# Patient Record
Sex: Male | Born: 1955 | Race: White | Hispanic: No | Marital: Single | Smoking: Current every day smoker
Health system: Southern US, Community
[De-identification: ages and names within clinical notes are randomized; demographics above are authoritative.]

## PROBLEM LIST (undated history)

## (undated) DIAGNOSIS — L97929 Non-pressure chronic ulcer of unspecified part of left lower leg with unspecified severity: Secondary | ICD-10-CM

## (undated) DIAGNOSIS — F17200 Nicotine dependence, unspecified, uncomplicated: Secondary | ICD-10-CM

## (undated) DIAGNOSIS — J449 Chronic obstructive pulmonary disease, unspecified: Secondary | ICD-10-CM

## (undated) DIAGNOSIS — F1021 Alcohol dependence, in remission: Secondary | ICD-10-CM

---

## 2010-08-08 ENCOUNTER — Emergency Department (HOSPITAL_COMMUNITY): Admission: EM | Admit: 2010-08-08 | Discharge: 2010-08-08 | Payer: Self-pay | Admitting: Emergency Medicine

## 2019-09-17 ENCOUNTER — Inpatient Hospital Stay
Admission: EM | Admit: 2019-09-17 | Payer: Medicare Other | Source: Other Acute Inpatient Hospital | Admitting: Pulmonary Disease

## 2019-09-17 DIAGNOSIS — D72829 Elevated white blood cell count, unspecified: Secondary | ICD-10-CM

## 2019-09-17 DIAGNOSIS — I361 Nonrheumatic tricuspid (valve) insufficiency: Secondary | ICD-10-CM

## 2019-09-17 DIAGNOSIS — L03119 Cellulitis of unspecified part of limb: Secondary | ICD-10-CM

## 2019-09-17 DIAGNOSIS — J9691 Respiratory failure, unspecified with hypoxia: Secondary | ICD-10-CM

## 2019-09-17 DIAGNOSIS — D696 Thrombocytopenia, unspecified: Secondary | ICD-10-CM | POA: Diagnosis not present

## 2019-09-17 DIAGNOSIS — R5381 Other malaise: Secondary | ICD-10-CM

## 2019-09-17 DIAGNOSIS — R739 Hyperglycemia, unspecified: Secondary | ICD-10-CM

## 2019-09-17 DIAGNOSIS — J189 Pneumonia, unspecified organism: Secondary | ICD-10-CM

## 2019-09-17 DIAGNOSIS — E872 Acidosis: Secondary | ICD-10-CM | POA: Diagnosis not present

## 2019-09-17 DIAGNOSIS — R7401 Elevation of levels of liver transaminase levels: Secondary | ICD-10-CM

## 2019-09-18 DIAGNOSIS — E872 Acidosis: Secondary | ICD-10-CM | POA: Diagnosis not present

## 2019-09-18 DIAGNOSIS — D696 Thrombocytopenia, unspecified: Secondary | ICD-10-CM | POA: Diagnosis not present

## 2019-09-18 DIAGNOSIS — R739 Hyperglycemia, unspecified: Secondary | ICD-10-CM | POA: Diagnosis not present

## 2019-09-18 DIAGNOSIS — J9691 Respiratory failure, unspecified with hypoxia: Secondary | ICD-10-CM | POA: Diagnosis not present

## 2019-09-19 DIAGNOSIS — R739 Hyperglycemia, unspecified: Secondary | ICD-10-CM | POA: Diagnosis not present

## 2019-09-19 DIAGNOSIS — D696 Thrombocytopenia, unspecified: Secondary | ICD-10-CM | POA: Diagnosis not present

## 2019-09-19 DIAGNOSIS — E872 Acidosis: Secondary | ICD-10-CM | POA: Diagnosis not present

## 2019-09-19 DIAGNOSIS — J9691 Respiratory failure, unspecified with hypoxia: Secondary | ICD-10-CM | POA: Diagnosis not present

## 2019-09-20 DIAGNOSIS — J9691 Respiratory failure, unspecified with hypoxia: Secondary | ICD-10-CM | POA: Diagnosis not present

## 2019-09-20 DIAGNOSIS — D696 Thrombocytopenia, unspecified: Secondary | ICD-10-CM | POA: Diagnosis not present

## 2019-09-20 DIAGNOSIS — E872 Acidosis: Secondary | ICD-10-CM | POA: Diagnosis not present

## 2019-09-20 DIAGNOSIS — R739 Hyperglycemia, unspecified: Secondary | ICD-10-CM | POA: Diagnosis not present

## 2019-09-21 DIAGNOSIS — R739 Hyperglycemia, unspecified: Secondary | ICD-10-CM | POA: Diagnosis not present

## 2019-09-21 DIAGNOSIS — E872 Acidosis: Secondary | ICD-10-CM | POA: Diagnosis not present

## 2019-09-21 DIAGNOSIS — D696 Thrombocytopenia, unspecified: Secondary | ICD-10-CM | POA: Diagnosis not present

## 2019-09-21 DIAGNOSIS — J9691 Respiratory failure, unspecified with hypoxia: Secondary | ICD-10-CM | POA: Diagnosis not present

## 2019-09-22 DIAGNOSIS — J9691 Respiratory failure, unspecified with hypoxia: Secondary | ICD-10-CM | POA: Diagnosis not present

## 2019-09-22 DIAGNOSIS — D696 Thrombocytopenia, unspecified: Secondary | ICD-10-CM | POA: Diagnosis not present

## 2019-09-22 DIAGNOSIS — R739 Hyperglycemia, unspecified: Secondary | ICD-10-CM | POA: Diagnosis not present

## 2019-09-22 DIAGNOSIS — E872 Acidosis: Secondary | ICD-10-CM | POA: Diagnosis not present

## 2019-09-23 DIAGNOSIS — E872 Acidosis: Secondary | ICD-10-CM | POA: Diagnosis not present

## 2019-09-23 DIAGNOSIS — R739 Hyperglycemia, unspecified: Secondary | ICD-10-CM | POA: Diagnosis not present

## 2019-09-23 DIAGNOSIS — J9691 Respiratory failure, unspecified with hypoxia: Secondary | ICD-10-CM | POA: Diagnosis not present

## 2019-09-23 DIAGNOSIS — D696 Thrombocytopenia, unspecified: Secondary | ICD-10-CM | POA: Diagnosis not present

## 2019-11-10 DIAGNOSIS — I4891 Unspecified atrial fibrillation: Secondary | ICD-10-CM | POA: Diagnosis present

## 2019-11-10 DIAGNOSIS — R918 Other nonspecific abnormal finding of lung field: Secondary | ICD-10-CM

## 2019-11-10 HISTORY — DX: Other nonspecific abnormal finding of lung field: R91.8

## 2019-11-10 HISTORY — DX: Unspecified atrial fibrillation: I48.91

## 2019-11-13 DIAGNOSIS — I509 Heart failure, unspecified: Secondary | ICD-10-CM | POA: Insufficient documentation

## 2019-11-13 DIAGNOSIS — Z720 Tobacco use: Secondary | ICD-10-CM | POA: Insufficient documentation

## 2019-11-19 DIAGNOSIS — I10 Essential (primary) hypertension: Secondary | ICD-10-CM | POA: Insufficient documentation

## 2019-11-19 DIAGNOSIS — J431 Panlobular emphysema: Secondary | ICD-10-CM | POA: Insufficient documentation

## 2019-11-19 DIAGNOSIS — R918 Other nonspecific abnormal finding of lung field: Secondary | ICD-10-CM | POA: Insufficient documentation

## 2020-01-13 ENCOUNTER — Encounter: Payer: Self-pay | Admitting: Internal Medicine

## 2020-01-13 ENCOUNTER — Encounter: Payer: Self-pay | Admitting: Specialist

## 2020-01-22 ENCOUNTER — Other Ambulatory Visit: Payer: Self-pay

## 2020-01-22 ENCOUNTER — Encounter: Payer: Self-pay | Admitting: Thoracic Surgery (Cardiothoracic Vascular Surgery)

## 2020-01-22 ENCOUNTER — Institutional Professional Consult (permissible substitution) (INDEPENDENT_AMBULATORY_CARE_PROVIDER_SITE_OTHER): Payer: Medicare Other | Admitting: Thoracic Surgery (Cardiothoracic Vascular Surgery)

## 2020-01-22 VITALS — BP 166/91 | HR 99 | Temp 97.3°F | Resp 24 | Ht 71.0 in | Wt 188.0 lb

## 2020-01-22 DIAGNOSIS — R918 Other nonspecific abnormal finding of lung field: Secondary | ICD-10-CM | POA: Diagnosis not present

## 2020-01-22 DIAGNOSIS — C3490 Malignant neoplasm of unspecified part of unspecified bronchus or lung: Secondary | ICD-10-CM

## 2020-01-22 NOTE — Progress Notes (Signed)
[address]       [place] [address]             [phone]                    Jazen [facility] Medical Record #[id number] Date of Birth: 02-27-1956  Referring: Gardiner Rhyme, MD Primary Care: Patient, No Pcp Per Primary Cardiologist: No primary care provider on file.  Chief Complaint:    Chief Complaint  Patient presents with  . Lung Lesion    Surgical eval, PET Scan 11/07/19, CTA Chest 09/16/19, Bronch 12/18/19    History of Present Illness:    Ryelan Kazee 64 y.o. male referred for surgical evaluation of a left upper lobe pulmonary mass.  This was first discovered on a CT scan from January of this year, and subsequent PET/CT showed avidity in the mass.  [place] and thus comes in now for further evaluation.  He does report some shortness of breath as well as some hoarseness.  He has lost about 5 pounds over the last month.  He also admits to an episode of dizziness that occurred only once.  He continues to smoke.       Zubrod Score: At the time of surgery this patient's most appropriate activity status/level should be described as: [x]     0    Normal activity, no symptoms []     1    Restricted in physical strenuous activity but ambulatory, able to do out light work []     2    Ambulatory and capable of self care, unable to do work activities, up and about               >50 % of waking hours                              []     3    Only limited self care, in bed greater than 50% of waking hours []     4    Completely disabled, no self care, confined to bed or chair []     5    Moribund   No past medical history on file.   No family history on file.   Social History   Tobacco Use  Smoking Status Not on file    Social History   Substance and Sexual Activity  Alcohol Use Not on file     Allergies  Allergen Reactions  . Sulfa Antibiotics     Current Outpatient Medications  Medication Sig Dispense  Refill  . Budeson-Glycopyrrol-Formoterol (BREZTRI AEROSPHERE) 160-9-4.8 MCG/ACT AERO Inhale 1 Inhaler into the lungs 2 (two) times daily.    . cholecalciferol (VITAMIN D3) 25 MCG (1000 UNIT) tablet Take 1,000 Units by mouth daily.    Marland Kitchen thiamine 100 MG tablet Take 1 tablet by mouth daily.     No current facility-administered medications for this visit.    Review of Systems  Constitutional: Positive for malaise/fatigue and weight loss.  Respiratory: Positive for cough, shortness of breath and stridor.   Cardiovascular: Negative for chest pain.  Gastrointestinal: Negative.   Neurological: Positive for dizziness.     PHYSICAL EXAMINATION: BP (!) 166/91 (BP Location: Left Arm)   Pulse 99   Temp (!) 97.3 F (36.3 C) (Temporal)   Resp (!) 24   Ht 5\' 11"  (1.803 m)  Wt 188 lb (85.3 kg)   SpO2 98% Comment: RA  BMI 26.22 kg/m  Physical Exam  Constitutional: He is oriented to person, place, and time. No distress.  HENT:  Head: Normocephalic.  Eyes: Conjunctivae are normal.  Neck: No tracheal deviation present.  Cardiovascular: Normal rate.  Respiratory: Effort normal. No respiratory distress.  GI: Soft. He exhibits no distension.  Musculoskeletal:        General: Normal range of motion.     Cervical back: Normal range of motion and neck supple.  Neurological: He is alert and oriented to person, place, and time.  Skin: Skin is warm and dry. He is not diaphoretic.    Diagnostic Studies & Laboratory data:     Recent Radiology Findings:   No results found.     I have independently reviewed the above radiology studies  and reviewed the findings with the patient.   Recent Lab Findings: No results found for: WBC, HGB, HCT, PLT, GLUCOSE, CHOL, TRIG, HDL, LDLDIRECT, LDLCALC, ALT, AST, NA, K, CL, CREATININE, BUN, CO2, TSH, INR, GLUF, HGBA1C      Assessment / Plan:   64 year old male with a left upper lobe pulmonary mass with invasion into the mediastinum.  I personally  reviewed the CT scan and the mass is in continuity with the left main pulmonary artery, and is in contact with the aortic arch.  Additionally the CT scan shows that his left hemidiaphragm is elevated indicating invasion into the phrenic nerve, and he is hoarse which is also concerning for recurrent laryngeal nerve involvement.  Based off of his imaging he is very high stage with a T4 tumor.  The AP nodes are in continuity with the mass thus these are likely involved as well.  He is at least a stage IIIb cancer.  He will require a tissue diagnosis.  The biopsy results from the outside hospital was suggestive for malignancy but inconclusive.  I recommended that he meet with our pulmonologist for a formal endobronchial ultrasound and biopsy, and as well as an MRI brain.  I have made a referral for medical and radiation oncology as well.     I  spent 30 minutes with  the patient face to face and greater then 50% of the time was spent in counseling and coordination of care.    Lajuana Matte 01/22/2020 3:20 PM

## 2020-02-05 ENCOUNTER — Other Ambulatory Visit: Payer: Self-pay

## 2020-02-05 ENCOUNTER — Ambulatory Visit (HOSPITAL_COMMUNITY)
Admission: RE | Admit: 2020-02-05 | Discharge: 2020-02-05 | Disposition: A | Discharge status: Home / Self Care | Payer: Medicare Other | Source: Ambulatory Visit | Attending: Thoracic Surgery (Cardiothoracic Vascular Surgery) | Admitting: Thoracic Surgery (Cardiothoracic Vascular Surgery)

## 2020-02-05 DIAGNOSIS — C3490 Malignant neoplasm of unspecified part of unspecified bronchus or lung: Secondary | ICD-10-CM | POA: Insufficient documentation

## 2020-02-05 MED ORDER — GADOBUTROL 1 MMOL/ML IV SOLN
8.5000 mL | Freq: Once | INTRAVENOUS | Status: AC | PRN
  Administered 2020-02-05: 8.5 mL via INTRAVENOUS

## 2020-02-25 ENCOUNTER — Encounter: Payer: Self-pay | Admitting: Pulmonary Disease

## 2020-03-17 ENCOUNTER — Institutional Professional Consult (permissible substitution): Payer: Medicare Other | Admitting: Pulmonary Disease

## 2020-03-27 ENCOUNTER — Emergency Department (HOSPITAL_COMMUNITY): Payer: Medicare Other | Admitting: Anesthesiology

## 2020-03-27 ENCOUNTER — Emergency Department (HOSPITAL_COMMUNITY): Payer: Medicare Other

## 2020-03-27 ENCOUNTER — Encounter (HOSPITAL_COMMUNITY): Payer: Self-pay | Admitting: Internal Medicine

## 2020-03-27 ENCOUNTER — Inpatient Hospital Stay (HOSPITAL_COMMUNITY): Payer: Medicare Other

## 2020-03-27 ENCOUNTER — Inpatient Hospital Stay (HOSPITAL_COMMUNITY)
Admission: EM | Admit: 2020-03-27 | Discharge: 2020-04-06 | DRG: 460 | Disposition: A | Discharge status: Skilled Nursing Facility | Payer: Medicare Other | Attending: Internal Medicine | Admitting: Internal Medicine

## 2020-03-27 ENCOUNTER — Other Ambulatory Visit: Payer: Self-pay

## 2020-03-27 ENCOUNTER — Encounter (HOSPITAL_COMMUNITY)
Admission: EM | Disposition: A | Discharge status: Skilled Nursing Facility | Payer: Self-pay | Source: Home / Self Care | Attending: Internal Medicine

## 2020-03-27 DIAGNOSIS — S32019A Unspecified fracture of first lumbar vertebra, initial encounter for closed fracture: Secondary | ICD-10-CM

## 2020-03-27 DIAGNOSIS — L97829 Non-pressure chronic ulcer of other part of left lower leg with unspecified severity: Secondary | ICD-10-CM | POA: Diagnosis present

## 2020-03-27 DIAGNOSIS — J439 Emphysema, unspecified: Secondary | ICD-10-CM | POA: Diagnosis present

## 2020-03-27 DIAGNOSIS — R630 Anorexia: Secondary | ICD-10-CM | POA: Diagnosis not present

## 2020-03-27 DIAGNOSIS — T1490XA Injury, unspecified, initial encounter: Secondary | ICD-10-CM | POA: Diagnosis not present

## 2020-03-27 DIAGNOSIS — D72829 Elevated white blood cell count, unspecified: Secondary | ICD-10-CM | POA: Diagnosis not present

## 2020-03-27 DIAGNOSIS — F1021 Alcohol dependence, in remission: Secondary | ICD-10-CM | POA: Diagnosis present

## 2020-03-27 DIAGNOSIS — S32001A Stable burst fracture of unspecified lumbar vertebra, initial encounter for closed fracture: Secondary | ICD-10-CM | POA: Diagnosis not present

## 2020-03-27 DIAGNOSIS — J449 Chronic obstructive pulmonary disease, unspecified: Secondary | ICD-10-CM | POA: Diagnosis not present

## 2020-03-27 DIAGNOSIS — L97929 Non-pressure chronic ulcer of unspecified part of left lower leg with unspecified severity: Secondary | ICD-10-CM | POA: Diagnosis present

## 2020-03-27 DIAGNOSIS — Y92410 Unspecified street and highway as the place of occurrence of the external cause: Secondary | ICD-10-CM | POA: Diagnosis not present

## 2020-03-27 DIAGNOSIS — Z87891 Personal history of nicotine dependence: Secondary | ICD-10-CM | POA: Diagnosis not present

## 2020-03-27 DIAGNOSIS — T07XXXA Unspecified multiple injuries, initial encounter: Secondary | ICD-10-CM

## 2020-03-27 DIAGNOSIS — Z66 Do not resuscitate: Secondary | ICD-10-CM | POA: Diagnosis not present

## 2020-03-27 DIAGNOSIS — S32029A Unspecified fracture of second lumbar vertebra, initial encounter for closed fracture: Secondary | ICD-10-CM | POA: Diagnosis present

## 2020-03-27 DIAGNOSIS — S301XXA Contusion of abdominal wall, initial encounter: Secondary | ICD-10-CM | POA: Diagnosis present

## 2020-03-27 DIAGNOSIS — J90 Pleural effusion, not elsewhere classified: Secondary | ICD-10-CM | POA: Diagnosis present

## 2020-03-27 DIAGNOSIS — Z7901 Long term (current) use of anticoagulants: Secondary | ICD-10-CM

## 2020-03-27 DIAGNOSIS — S32049A Unspecified fracture of fourth lumbar vertebra, initial encounter for closed fracture: Secondary | ICD-10-CM | POA: Diagnosis present

## 2020-03-27 DIAGNOSIS — R079 Chest pain, unspecified: Secondary | ICD-10-CM | POA: Diagnosis present

## 2020-03-27 DIAGNOSIS — M459 Ankylosing spondylitis of unspecified sites in spine: Secondary | ICD-10-CM | POA: Diagnosis present

## 2020-03-27 DIAGNOSIS — F17210 Nicotine dependence, cigarettes, uncomplicated: Secondary | ICD-10-CM | POA: Diagnosis present

## 2020-03-27 DIAGNOSIS — C3412 Malignant neoplasm of upper lobe, left bronchus or lung: Secondary | ICD-10-CM | POA: Diagnosis present

## 2020-03-27 DIAGNOSIS — R42 Dizziness and giddiness: Secondary | ICD-10-CM | POA: Diagnosis not present

## 2020-03-27 DIAGNOSIS — R918 Other nonspecific abnormal finding of lung field: Secondary | ICD-10-CM

## 2020-03-27 DIAGNOSIS — F17200 Nicotine dependence, unspecified, uncomplicated: Secondary | ICD-10-CM | POA: Diagnosis present

## 2020-03-27 DIAGNOSIS — D509 Iron deficiency anemia, unspecified: Secondary | ICD-10-CM | POA: Diagnosis present

## 2020-03-27 DIAGNOSIS — S32011A Stable burst fracture of first lumbar vertebra, initial encounter for closed fracture: Principal | ICD-10-CM | POA: Diagnosis present

## 2020-03-27 DIAGNOSIS — I4891 Unspecified atrial fibrillation: Secondary | ICD-10-CM | POA: Diagnosis present

## 2020-03-27 DIAGNOSIS — S32039A Unspecified fracture of third lumbar vertebra, initial encounter for closed fracture: Secondary | ICD-10-CM | POA: Diagnosis present

## 2020-03-27 DIAGNOSIS — I480 Paroxysmal atrial fibrillation: Secondary | ICD-10-CM

## 2020-03-27 DIAGNOSIS — J431 Panlobular emphysema: Secondary | ICD-10-CM | POA: Diagnosis not present

## 2020-03-27 DIAGNOSIS — Z419 Encounter for procedure for purposes other than remedying health state, unspecified: Secondary | ICD-10-CM

## 2020-03-27 DIAGNOSIS — Z23 Encounter for immunization: Secondary | ICD-10-CM

## 2020-03-27 DIAGNOSIS — Z9889 Other specified postprocedural states: Secondary | ICD-10-CM

## 2020-03-27 DIAGNOSIS — Z79899 Other long term (current) drug therapy: Secondary | ICD-10-CM | POA: Diagnosis not present

## 2020-03-27 DIAGNOSIS — Z882 Allergy status to sulfonamides status: Secondary | ICD-10-CM | POA: Diagnosis not present

## 2020-03-27 DIAGNOSIS — R911 Solitary pulmonary nodule: Secondary | ICD-10-CM | POA: Diagnosis present

## 2020-03-27 DIAGNOSIS — Z20822 Contact with and (suspected) exposure to covid-19: Secondary | ICD-10-CM | POA: Diagnosis present

## 2020-03-27 DIAGNOSIS — L0390 Cellulitis, unspecified: Secondary | ICD-10-CM | POA: Diagnosis not present

## 2020-03-27 HISTORY — PX: POSTERIOR LUMBAR FUSION 4 LEVEL: SHX6037

## 2020-03-27 HISTORY — DX: Alcohol dependence, in remission: F10.21

## 2020-03-27 HISTORY — DX: Nicotine dependence, unspecified, uncomplicated: F17.200

## 2020-03-27 HISTORY — DX: Non-pressure chronic ulcer of unspecified part of left lower leg with unspecified severity: L97.929

## 2020-03-27 HISTORY — DX: Chronic obstructive pulmonary disease, unspecified: J44.9

## 2020-03-27 LAB — COMPREHENSIVE METABOLIC PANEL
ALT: 12 U/L (ref 0–44)
AST: 20 U/L (ref 15–41)
Albumin: 3 g/dL — ABNORMAL LOW (ref 3.5–5.0)
Alkaline Phosphatase: 69 U/L (ref 38–126)
Anion gap: 12 (ref 5–15)
BUN: <5 mg/dL — ABNORMAL LOW (ref 8–23)
CO2: 24 mmol/L (ref 22–32)
Calcium: 8.5 mg/dL — ABNORMAL LOW (ref 8.9–10.3)
Chloride: 99 mmol/L (ref 98–111)
Creatinine, Ser: 0.99 mg/dL (ref 0.61–1.24)
GFR calc Af Amer: >60 mL/min (ref >60)
GFR calc non Af Amer: >60 mL/min (ref >60)
Glucose, Bld: 121 mg/dL — ABNORMAL HIGH (ref 70–99)
Potassium: 3.9 mmol/L (ref 3.5–5.1)
Sodium: 135 mmol/L (ref 135–145)
Total Bilirubin: 0.6 mg/dL (ref 0.3–1.2)
Total Protein: 7 g/dL (ref 6.5–8.1)

## 2020-03-27 LAB — CBC
HCT: 35.6 % — ABNORMAL LOW (ref 39.0–52.0)
Hemoglobin: 10.4 g/dL — ABNORMAL LOW (ref 13.0–17.0)
MCH: 20.3 pg — ABNORMAL LOW (ref 26.0–34.0)
MCHC: 29.2 g/dL — ABNORMAL LOW (ref 30.0–36.0)
MCV: 69.5 fL — ABNORMAL LOW (ref 80.0–100.0)
Platelets: 268 10*3/uL (ref 150–400)
RBC: 5.12 MIL/uL (ref 4.22–5.81)
RDW: 22.4 % — ABNORMAL HIGH (ref 11.5–15.5)
WBC: 15.4 10*3/uL — ABNORMAL HIGH (ref 4.0–10.5)
nRBC: 0 % (ref 0.0–0.2)

## 2020-03-27 LAB — PROTIME-INR
INR: 1.2 (ref 0.8–1.2)
Prothrombin Time: 14.6 seconds (ref 11.4–15.2)

## 2020-03-27 LAB — URINALYSIS, ROUTINE W REFLEX MICROSCOPIC
Bilirubin Urine: NEGATIVE
Glucose, UA: NEGATIVE mg/dL
Hgb urine dipstick: NEGATIVE
Ketones, ur: NEGATIVE mg/dL
Leukocytes,Ua: NEGATIVE
Nitrite: NEGATIVE
Protein, ur: 30 mg/dL — AB
Specific Gravity, Urine: >1.046 — ABNORMAL HIGH (ref 1.005–1.030)
pH: 7 (ref 5.0–8.0)

## 2020-03-27 LAB — RAPID URINE DRUG SCREEN, HOSP PERFORMED
Amphetamines: NOT DETECTED
Barbiturates: NOT DETECTED
Benzodiazepines: NOT DETECTED
Cocaine: NOT DETECTED
Opiates: NOT DETECTED
Tetrahydrocannabinol: NOT DETECTED

## 2020-03-27 LAB — I-STAT CHEM 8, ED
BUN: 4 mg/dL — ABNORMAL LOW (ref 8–23)
Calcium, Ion: 1.04 mmol/L — ABNORMAL LOW (ref 1.15–1.40)
Chloride: 99 mmol/L (ref 98–111)
Creatinine, Ser: 0.8 mg/dL (ref 0.61–1.24)
Glucose, Bld: 118 mg/dL — ABNORMAL HIGH (ref 70–99)
HCT: 38 % — ABNORMAL LOW (ref 39.0–52.0)
Hemoglobin: 12.9 g/dL — ABNORMAL LOW (ref 13.0–17.0)
Potassium: 3.8 mmol/L (ref 3.5–5.1)
Sodium: 138 mmol/L (ref 135–145)
TCO2: 25 mmol/L (ref 22–32)

## 2020-03-27 LAB — TYPE AND SCREEN
ABO/RH(D): B POS
Antibody Screen: NEGATIVE

## 2020-03-27 LAB — ETHANOL: Alcohol, Ethyl (B): <10 mg/dL (ref <10)

## 2020-03-27 LAB — SAMPLE TO BLOOD BANK

## 2020-03-27 LAB — SARS CORONAVIRUS 2 BY RT PCR (HOSPITAL ORDER, PERFORMED IN ~~LOC~~ HOSPITAL LAB): SARS Coronavirus 2: NEGATIVE

## 2020-03-27 LAB — LACTIC ACID, PLASMA: Lactic Acid, Venous: 2.6 mmol/L (ref 0.5–1.9)

## 2020-03-27 SURGERY — POSTERIOR LUMBAR FUSION 4 LEVEL
Anesthesia: General | Site: Back

## 2020-03-27 MED ORDER — ZOLPIDEM TARTRATE 5 MG PO TABS
5.0000 mg | ORAL_TABLET | Freq: Every evening | ORAL | Status: DC | PRN
  Filled 2020-03-27 (×2): qty 1

## 2020-03-27 MED ORDER — SUCCINYLCHOLINE CHLORIDE 200 MG/10ML IV SOSY
PREFILLED_SYRINGE | INTRAVENOUS | Status: DC | PRN
  Administered 2020-03-27: 120 mg via INTRAVENOUS

## 2020-03-27 MED ORDER — NICOTINE 14 MG/24HR TD PT24
14.0000 mg | MEDICATED_PATCH | Freq: Every day | TRANSDERMAL | Status: DC
  Administered 2020-03-28 – 2020-04-06 (×10): 14 mg via TRANSDERMAL
  Filled 2020-03-27 (×10): qty 1

## 2020-03-27 MED ORDER — SODIUM CHLORIDE 0.9 % IV SOLN
250.0000 mL | INTRAVENOUS | Status: DC
  Administered 2020-03-28: 250 mL via INTRAVENOUS

## 2020-03-27 MED ORDER — ACETAMINOPHEN 650 MG RE SUPP: 650.0000 mg | Freq: Four times a day (QID) | RECTAL | Status: DC | PRN

## 2020-03-27 MED ORDER — ONDANSETRON HCL 4 MG/2ML IJ SOLN: 4.0000 mg | Freq: Once | INTRAMUSCULAR | Status: DC | PRN

## 2020-03-27 MED ORDER — FENTANYL CITRATE (PF) 100 MCG/2ML IJ SOLN
INTRAMUSCULAR | Status: DC | PRN
  Administered 2020-03-27: 50 ug via INTRAVENOUS

## 2020-03-27 MED ORDER — LIDOCAINE-EPINEPHRINE 1 %-1:100000 IJ SOLN
INTRAMUSCULAR | Status: AC
  Filled 2020-03-27: qty 1

## 2020-03-27 MED ORDER — FENTANYL CITRATE (PF) 100 MCG/2ML IJ SOLN
50.0000 ug | Freq: Once | INTRAMUSCULAR | Status: AC
  Administered 2020-03-27: 50 ug via INTRAVENOUS
  Filled 2020-03-27: qty 2

## 2020-03-27 MED ORDER — ONDANSETRON HCL 4 MG/2ML IJ SOLN
4.0000 mg | Freq: Four times a day (QID) | INTRAMUSCULAR | Status: DC | PRN
Start: 2020-03-27 — End: 2020-03-27

## 2020-03-27 MED ORDER — BUPIVACAINE HCL (PF) 0.25 % IJ SOLN
INTRAMUSCULAR | Status: DC | PRN
  Administered 2020-03-27: 10 mL

## 2020-03-27 MED ORDER — LACTATED RINGERS IV SOLN: INTRAVENOUS | Status: DC

## 2020-03-27 MED ORDER — ALUM & MAG HYDROXIDE-SIMETH 200-200-20 MG/5ML PO SUSP
30.0000 mL | Freq: Four times a day (QID) | ORAL | Status: DC | PRN
  Administered 2020-04-01 – 2020-04-03 (×4): 30 mL via ORAL
  Filled 2020-03-27 (×4): qty 30

## 2020-03-27 MED ORDER — HYDROMORPHONE HCL 1 MG/ML IJ SOLN
0.2500 mg | INTRAMUSCULAR | Status: DC | PRN
  Administered 2020-03-27 (×2): 0.25 mg via INTRAVENOUS

## 2020-03-27 MED ORDER — HYDROMORPHONE HCL 1 MG/ML IJ SOLN
INTRAMUSCULAR | Status: AC
  Filled 2020-03-27: qty 1

## 2020-03-27 MED ORDER — CEFAZOLIN SODIUM-DEXTROSE 2-4 GM/100ML-% IV SOLN: 2.0000 g | Freq: Three times a day (TID) | INTRAVENOUS | Status: DC

## 2020-03-27 MED ORDER — IOHEXOL 300 MG/ML  SOLN
100.0000 mL | Freq: Once | INTRAMUSCULAR | Status: AC | PRN
  Administered 2020-03-27: 100 mL via INTRAVENOUS

## 2020-03-27 MED ORDER — HYDRALAZINE HCL 20 MG/ML IJ SOLN: 5.0000 mg | INTRAMUSCULAR | Status: DC | PRN

## 2020-03-27 MED ORDER — POLYETHYLENE GLYCOL 3350 17 G PO PACK
17.0000 g | PACK | Freq: Every day | ORAL | Status: DC | PRN
  Administered 2020-04-05: 17 g via ORAL
  Filled 2020-03-27: qty 1

## 2020-03-27 MED ORDER — LIDOCAINE-EPINEPHRINE 1 %-1:100000 IJ SOLN
INTRAMUSCULAR | Status: DC | PRN
  Administered 2020-03-27: 10 mL via INTRADERMAL

## 2020-03-27 MED ORDER — PROPOFOL 10 MG/ML IV BOLUS
INTRAVENOUS | Status: DC | PRN
  Administered 2020-03-27: 110 mg via INTRAVENOUS

## 2020-03-27 MED ORDER — THROMBIN 20000 UNITS EX SOLR
CUTANEOUS | Status: AC
  Filled 2020-03-27: qty 20000

## 2020-03-27 MED ORDER — THROMBIN 5000 UNITS EX SOLR
CUTANEOUS | Status: AC
  Filled 2020-03-27: qty 5000

## 2020-03-27 MED ORDER — ONDANSETRON HCL 4 MG PO TABS: 4.0000 mg | ORAL_TABLET | Freq: Four times a day (QID) | ORAL | Status: DC | PRN

## 2020-03-27 MED ORDER — ONDANSETRON HCL 4 MG/2ML IJ SOLN
4.0000 mg | Freq: Four times a day (QID) | INTRAMUSCULAR | Status: DC | PRN
  Administered 2020-03-28: 4 mg via INTRAVENOUS
  Filled 2020-03-27: qty 2

## 2020-03-27 MED ORDER — METRONIDAZOLE IN NACL 5-0.79 MG/ML-% IV SOLN
500.0000 mg | Freq: Three times a day (TID) | INTRAVENOUS | Status: DC
  Administered 2020-03-28 (×2): 500 mg via INTRAVENOUS
  Filled 2020-03-27 (×2): qty 100

## 2020-03-27 MED ORDER — ROCURONIUM BROMIDE 10 MG/ML (PF) SYRINGE
PREFILLED_SYRINGE | INTRAVENOUS | Status: DC | PRN
  Administered 2020-03-27: 10 mg via INTRAVENOUS
  Administered 2020-03-27: 50 mg via INTRAVENOUS

## 2020-03-27 MED ORDER — PROPOFOL 10 MG/ML IV BOLUS
INTRAVENOUS | Status: AC
  Filled 2020-03-27: qty 20

## 2020-03-27 MED ORDER — SODIUM CHLORIDE 0.9 % IV SOLN
2.0000 g | Freq: Every day | INTRAVENOUS | Status: DC
  Administered 2020-03-28 – 2020-04-02 (×6): 2 g via INTRAVENOUS
  Filled 2020-03-27 (×6): qty 20

## 2020-03-27 MED ORDER — FLUTICASONE FUROATE-VILANTEROL 100-25 MCG/INH IN AEPB
1.0000 | INHALATION_SPRAY | Freq: Every day | RESPIRATORY_TRACT | Status: DC
  Administered 2020-03-30 – 2020-04-05 (×7): 1 via RESPIRATORY_TRACT
  Filled 2020-03-27: qty 28

## 2020-03-27 MED ORDER — HYDROCODONE-ACETAMINOPHEN 5-325 MG PO TABS
1.0000 | ORAL_TABLET | ORAL | Status: DC | PRN
  Administered 2020-03-28 – 2020-04-06 (×26): 2 via ORAL
  Filled 2020-03-27 (×26): qty 2

## 2020-03-27 MED ORDER — ACETAMINOPHEN 325 MG PO TABS: 650.0000 mg | ORAL_TABLET | Freq: Four times a day (QID) | ORAL | Status: DC | PRN

## 2020-03-27 MED ORDER — DEXAMETHASONE SODIUM PHOSPHATE 10 MG/ML IJ SOLN
INTRAMUSCULAR | Status: DC | PRN
  Administered 2020-03-27: 10 mg via INTRAVENOUS

## 2020-03-27 MED ORDER — OXYCODONE HCL 5 MG PO TABS
10.0000 mg | ORAL_TABLET | ORAL | Status: DC | PRN
  Administered 2020-03-28 – 2020-04-04 (×13): 10 mg via ORAL
  Filled 2020-03-27 (×15): qty 2

## 2020-03-27 MED ORDER — HYDROMORPHONE HCL 1 MG/ML IJ SOLN
1.0000 mg | INTRAMUSCULAR | Status: DC | PRN
  Administered 2020-03-29: 1 mg via INTRAVENOUS
  Filled 2020-03-27: qty 1

## 2020-03-27 MED ORDER — DOCUSATE SODIUM 100 MG PO CAPS
100.0000 mg | ORAL_CAPSULE | Freq: Two times a day (BID) | ORAL | Status: DC
  Administered 2020-03-28 – 2020-04-06 (×17): 100 mg via ORAL
  Filled 2020-03-27 (×18): qty 1

## 2020-03-27 MED ORDER — BISACODYL 5 MG PO TBEC
5.0000 mg | DELAYED_RELEASE_TABLET | Freq: Every day | ORAL | Status: DC | PRN
  Administered 2020-04-05: 5 mg via ORAL
  Filled 2020-03-27: qty 1

## 2020-03-27 MED ORDER — MIDAZOLAM HCL 2 MG/2ML IJ SOLN
INTRAMUSCULAR | Status: AC
  Filled 2020-03-27: qty 2

## 2020-03-27 MED ORDER — METRONIDAZOLE IN NACL 5-0.79 MG/ML-% IV SOLN: 500.0000 mg | Freq: Three times a day (TID) | INTRAVENOUS | Status: DC

## 2020-03-27 MED ORDER — PHENYLEPHRINE HCL (PRESSORS) 10 MG/ML IV SOLN
INTRAVENOUS | Status: DC | PRN
  Administered 2020-03-27 (×2): 80 ug via INTRAVENOUS

## 2020-03-27 MED ORDER — ALBUTEROL SULFATE (2.5 MG/3ML) 0.083% IN NEBU
2.5000 mg | INHALATION_SOLUTION | Freq: Four times a day (QID) | RESPIRATORY_TRACT | Status: DC | PRN
  Administered 2020-03-28: 2.5 mg via RESPIRATORY_TRACT
  Filled 2020-03-27: qty 3

## 2020-03-27 MED ORDER — SODIUM CHLORIDE 0.9 % IV SOLN
INTRAVENOUS | Status: DC | PRN
  Administered 2020-03-27: 500 mL

## 2020-03-27 MED ORDER — PANTOPRAZOLE SODIUM 40 MG IV SOLR
40.0000 mg | Freq: Every day | INTRAVENOUS | Status: DC
  Administered 2020-03-28: 40 mg via INTRAVENOUS
  Filled 2020-03-27: qty 40

## 2020-03-27 MED ORDER — ACETAMINOPHEN 650 MG RE SUPP: 650.0000 mg | RECTAL | Status: DC | PRN

## 2020-03-27 MED ORDER — SUGAMMADEX SODIUM 200 MG/2ML IV SOLN
INTRAVENOUS | Status: DC | PRN
  Administered 2020-03-27: 200 mg via INTRAVENOUS

## 2020-03-27 MED ORDER — THROMBIN 20000 UNITS EX SOLR
CUTANEOUS | Status: DC | PRN
  Administered 2020-03-27: 20 mL via TOPICAL

## 2020-03-27 MED ORDER — CEFAZOLIN SODIUM-DEXTROSE 2-4 GM/100ML-% IV SOLN
2.0000 g | Freq: Once | INTRAVENOUS | Status: AC
  Administered 2020-03-27: 2 g via INTRAVENOUS

## 2020-03-27 MED ORDER — PHENOL 1.4 % MT LIQD
1.0000 | OROMUCOSAL | Status: DC | PRN
  Filled 2020-03-27: qty 177

## 2020-03-27 MED ORDER — FENTANYL CITRATE (PF) 250 MCG/5ML IJ SOLN
INTRAMUSCULAR | Status: DC | PRN
  Administered 2020-03-27: 100 ug via INTRAVENOUS
  Administered 2020-03-27: 50 ug via INTRAVENOUS
  Administered 2020-03-27 (×2): 100 ug via INTRAVENOUS
  Administered 2020-03-27: 50 ug via INTRAVENOUS

## 2020-03-27 MED ORDER — ACETAMINOPHEN 325 MG PO TABS
650.0000 mg | ORAL_TABLET | ORAL | Status: DC | PRN
  Administered 2020-03-29 – 2020-04-03 (×2): 650 mg via ORAL
  Filled 2020-03-27 (×3): qty 2

## 2020-03-27 MED ORDER — FENTANYL CITRATE (PF) 100 MCG/2ML IJ SOLN
50.0000 ug | Freq: Once | INTRAMUSCULAR | Status: AC
  Administered 2020-03-27: 50 ug via INTRAVENOUS

## 2020-03-27 MED ORDER — MIDAZOLAM HCL 2 MG/2ML IJ SOLN
INTRAMUSCULAR | Status: DC | PRN
  Administered 2020-03-27: 2 mg via INTRAVENOUS
  Administered 2020-03-27: 1 mg via INTRAVENOUS

## 2020-03-27 MED ORDER — CYCLOBENZAPRINE HCL 10 MG PO TABS
10.0000 mg | ORAL_TABLET | Freq: Three times a day (TID) | ORAL | Status: DC | PRN
  Administered 2020-03-30 (×2): 10 mg via ORAL
  Filled 2020-03-27 (×3): qty 1

## 2020-03-27 MED ORDER — ONDANSETRON HCL 4 MG/2ML IJ SOLN
INTRAMUSCULAR | Status: DC | PRN
  Administered 2020-03-27: 4 mg via INTRAVENOUS

## 2020-03-27 MED ORDER — ALBUMIN HUMAN 5 % IV SOLN
INTRAVENOUS | Status: DC | PRN
Start: 2020-03-27 — End: 2020-03-27

## 2020-03-27 MED ORDER — LIDOCAINE 2% (20 MG/ML) 5 ML SYRINGE
INTRAMUSCULAR | Status: DC | PRN
  Administered 2020-03-27: 100 mg via INTRAVENOUS

## 2020-03-27 MED ORDER — MORPHINE SULFATE (PF) 2 MG/ML IV SOLN: 2.0000 mg | INTRAVENOUS | Status: DC | PRN

## 2020-03-27 MED ORDER — SODIUM CHLORIDE 0.9 % IV SOLN: 2.0000 g | INTRAVENOUS | Status: DC

## 2020-03-27 MED ORDER — CEFAZOLIN SODIUM-DEXTROSE 2-4 GM/100ML-% IV SOLN
INTRAVENOUS | Status: AC
  Filled 2020-03-27: qty 100

## 2020-03-27 MED ORDER — HYDROMORPHONE HCL 1 MG/ML IJ SOLN
1.0000 mg | Freq: Once | INTRAMUSCULAR | Status: AC
  Administered 2020-03-27: 1 mg via INTRAVENOUS
  Filled 2020-03-27: qty 1

## 2020-03-27 MED ORDER — PHENYLEPHRINE HCL-NACL 10-0.9 MG/250ML-% IV SOLN
INTRAVENOUS | Status: DC | PRN
  Administered 2020-03-27: 25 ug/min via INTRAVENOUS

## 2020-03-27 MED ORDER — SODIUM CHLORIDE 0.9% FLUSH
3.0000 mL | Freq: Two times a day (BID) | INTRAVENOUS | Status: DC
  Administered 2020-03-28 – 2020-04-06 (×18): 3 mL via INTRAVENOUS

## 2020-03-27 MED ORDER — THROMBIN 5000 UNITS EX SOLR
OROMUCOSAL | Status: DC | PRN
  Administered 2020-03-27: 5 mL via TOPICAL

## 2020-03-27 MED ORDER — MEPERIDINE HCL 25 MG/ML IJ SOLN: 6.2500 mg | INTRAMUSCULAR | Status: DC | PRN

## 2020-03-27 MED ORDER — 0.9 % SODIUM CHLORIDE (POUR BTL) OPTIME
TOPICAL | Status: DC | PRN
  Administered 2020-03-27: 1000 mL

## 2020-03-27 MED ORDER — MENTHOL 3 MG MT LOZG
1.0000 | LOZENGE | OROMUCOSAL | Status: DC | PRN
  Administered 2020-04-05: 3 mg via ORAL
  Filled 2020-03-27: qty 9

## 2020-03-27 MED ORDER — FENTANYL CITRATE (PF) 250 MCG/5ML IJ SOLN
INTRAMUSCULAR | Status: AC
  Filled 2020-03-27: qty 5

## 2020-03-27 MED ORDER — SODIUM CHLORIDE 0.9% FLUSH
3.0000 mL | INTRAVENOUS | Status: DC | PRN
  Administered 2020-03-29: 3 mL via INTRAVENOUS

## 2020-03-27 MED ORDER — SODIUM CHLORIDE 0.9% FLUSH
3.0000 mL | Freq: Two times a day (BID) | INTRAVENOUS | Status: DC
  Administered 2020-03-28 – 2020-04-06 (×15): 3 mL via INTRAVENOUS

## 2020-03-27 MED ORDER — DILTIAZEM HCL 60 MG PO TABS
30.0000 mg | ORAL_TABLET | Freq: Four times a day (QID) | ORAL | Status: DC
  Administered 2020-03-28 – 2020-04-06 (×27): 30 mg via ORAL
  Filled 2020-03-27 (×36): qty 1

## 2020-03-27 MED ORDER — TETANUS-DIPHTH-ACELL PERTUSSIS 5-2.5-18.5 LF-MCG/0.5 IM SUSP
0.5000 mL | Freq: Once | INTRAMUSCULAR | Status: AC
  Administered 2020-03-27: 0.5 mL via INTRAMUSCULAR

## 2020-03-27 MED ORDER — BUPIVACAINE HCL (PF) 0.25 % IJ SOLN
INTRAMUSCULAR | Status: AC
  Filled 2020-03-27: qty 30

## 2020-03-27 SURGICAL SUPPLY — 77 items
BAG DECANTER FOR FLEXI CONT (MISCELLANEOUS) ×2 IMPLANT
BENZOIN TINCTURE PRP APPL 2/3 (GAUZE/BANDAGES/DRESSINGS) ×2 IMPLANT
BLADE CLIPPER SURG (BLADE) IMPLANT
BLADE SURG 11 STRL SS (BLADE) ×2 IMPLANT
BONE VIVIGEN FORMABLE 10CC (Bone Implant) ×2 IMPLANT
BUR CUTTER 7.0 ROUND (BURR) ×2 IMPLANT
BUR MATCHSTICK NEURO 3.0 LAGG (BURR) ×2 IMPLANT
CANISTER SUCT 3000ML PPV (MISCELLANEOUS) ×2 IMPLANT
CAP LOCKING THREADED (Cap) ×16 IMPLANT
CARTRIDGE OIL MAESTRO DRILL (MISCELLANEOUS) ×1 IMPLANT
CLSR STERI-STRIP ANTIMIC 1/2X4 (GAUZE/BANDAGES/DRESSINGS) ×4 IMPLANT
CNTNR URN SCR LID CUP LEK RST (MISCELLANEOUS) ×3 IMPLANT
CONT SPEC 4OZ STRL OR WHT (MISCELLANEOUS) ×6
COVER BACK TABLE 60X90IN (DRAPES) ×2 IMPLANT
COVER WAND RF STERILE (DRAPES) ×2 IMPLANT
DECANTER SPIKE VIAL GLASS SM (MISCELLANEOUS) ×2 IMPLANT
DERMABOND ADVANCED (GAUZE/BANDAGES/DRESSINGS) ×2
DERMABOND ADVANCED .7 DNX12 (GAUZE/BANDAGES/DRESSINGS) ×2 IMPLANT
DIFFUSER DRILL AIR PNEUMATIC (MISCELLANEOUS) ×2 IMPLANT
DRAPE C-ARM 42X72 X-RAY (DRAPES) ×2 IMPLANT
DRAPE C-ARMOR (DRAPES) IMPLANT
DRAPE HALF SHEET 40X57 (DRAPES) IMPLANT
DRAPE LAPAROTOMY 100X72X124 (DRAPES) ×2 IMPLANT
DRAPE SURG 17X23 STRL (DRAPES) ×2 IMPLANT
DRSG OPSITE 4X5.5 SM (GAUZE/BANDAGES/DRESSINGS) ×2 IMPLANT
DRSG OPSITE POSTOP 4X10 (GAUZE/BANDAGES/DRESSINGS) ×2 IMPLANT
DRSG OPSITE POSTOP 4X8 (GAUZE/BANDAGES/DRESSINGS) ×2 IMPLANT
DURAPREP 26ML APPLICATOR (WOUND CARE) ×2 IMPLANT
ELECT REM PT RETURN 9FT ADLT (ELECTROSURGICAL) ×2
ELECTRODE REM PT RTRN 9FT ADLT (ELECTROSURGICAL) ×1 IMPLANT
EVACUATOR 3/16  PVC DRAIN (DRAIN) ×2
EVACUATOR 3/16 PVC DRAIN (DRAIN) ×1 IMPLANT
FIBER BOAT ALLOFUSE 15CC (Bone Implant) ×2 IMPLANT
GAUZE 4X4 16PLY RFD (DISPOSABLE) ×2 IMPLANT
GAUZE SPONGE 4X4 12PLY STRL (GAUZE/BANDAGES/DRESSINGS) ×2 IMPLANT
GAUZE SPONGE 4X4 16PLY XRAY LF (GAUZE/BANDAGES/DRESSINGS) ×2 IMPLANT
GLOVE BIO SURGEON STRL SZ7 (GLOVE) ×4 IMPLANT
GLOVE BIO SURGEON STRL SZ8 (GLOVE) ×4 IMPLANT
GLOVE BIOGEL PI IND STRL 7.0 (GLOVE) ×1 IMPLANT
GLOVE BIOGEL PI INDICATOR 7.0 (GLOVE) ×1
GLOVE BIOGEL PI ORTHO PRO 7.5 (GLOVE) ×1
GLOVE ECLIPSE 7.0 STRL STRAW (GLOVE) ×4 IMPLANT
GLOVE EXAM NITRILE XL STR (GLOVE) IMPLANT
GLOVE INDICATOR 8.5 STRL (GLOVE) ×4 IMPLANT
GLOVE PI ORTHO PRO STRL 7.5 (GLOVE) ×1 IMPLANT
GOWN STRL REUS W/ TWL LRG LVL3 (GOWN DISPOSABLE) ×1 IMPLANT
GOWN STRL REUS W/ TWL XL LVL3 (GOWN DISPOSABLE) ×2 IMPLANT
GOWN STRL REUS W/TWL 2XL LVL3 (GOWN DISPOSABLE) IMPLANT
GOWN STRL REUS W/TWL LRG LVL3 (GOWN DISPOSABLE) ×2
GOWN STRL REUS W/TWL XL LVL3 (GOWN DISPOSABLE) ×4
GRAFT TRINITY ELITE LGE HUMAN (Tissue) ×2 IMPLANT
HEMOSTAT POWDER KIT SURGIFOAM (HEMOSTASIS) IMPLANT
KIT BASIN OR (CUSTOM PROCEDURE TRAY) ×2 IMPLANT
KIT POSITION SURG JACKSON T1 (MISCELLANEOUS) ×2 IMPLANT
KIT TURNOVER KIT B (KITS) ×2 IMPLANT
MILL MEDIUM DISP (BLADE) ×2 IMPLANT
NEEDLE HYPO 21X1.5 SAFETY (NEEDLE) ×2 IMPLANT
NEEDLE HYPO 25X1 1.5 SAFETY (NEEDLE) ×2 IMPLANT
NS IRRIG 1000ML POUR BTL (IV SOLUTION) ×2 IMPLANT
OIL CARTRIDGE MAESTRO DRILL (MISCELLANEOUS) ×2
PACK LAMINECTOMY NEURO (CUSTOM PROCEDURE TRAY) ×2 IMPLANT
PAD ARMBOARD 7.5X6 YLW CONV (MISCELLANEOUS) ×6 IMPLANT
ROD CREO STRAIGHT 125MM (Rod) ×4 IMPLANT
SCREW 6.5X45 (Screw) ×16 IMPLANT
SPONGE LAP 4X18 RFD (DISPOSABLE) IMPLANT
SPONGE SURGIFOAM ABS GEL 100 (HEMOSTASIS) ×2 IMPLANT
STRIP CLOSURE SKIN 1/2X4 (GAUZE/BANDAGES/DRESSINGS) ×4 IMPLANT
SUT VIC AB 0 CT1 18XCR BRD8 (SUTURE) ×2 IMPLANT
SUT VIC AB 0 CT1 8-18 (SUTURE) ×4
SUT VIC AB 2-0 CT1 18 (SUTURE) ×4 IMPLANT
SUT VIC AB 4-0 PS2 27 (SUTURE) ×2 IMPLANT
SYR 20ML LL LF (SYRINGE) ×2 IMPLANT
SYR CONTROL 10ML LL (SYRINGE) ×2 IMPLANT
TOWEL GREEN STERILE (TOWEL DISPOSABLE) ×2 IMPLANT
TOWEL GREEN STERILE FF (TOWEL DISPOSABLE) ×2 IMPLANT
TRAY FOLEY MTR SLVR 16FR STAT (SET/KITS/TRAYS/PACK) ×2 IMPLANT
WATER STERILE IRR 1000ML POUR (IV SOLUTION) ×2 IMPLANT
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
## 2020-03-27 NOTE — ED Notes (Signed)
Help get patient undress on the monitor did manual blood pressure 118/72 patient is resting with nurse at bedside

## 2020-03-27 NOTE — Transfer of Care (Signed)
Immediate Anesthesia Transfer of Care Note  Patient: RONDEL EPISCOPO  Procedure(s) Performed: POSTERIOR LUMBAR FUSION with Screws and posterolateral arthrodesis Thoracic Twelve - Lumbar Three (N/A Back)  Patient Location: PACU  Anesthesia Type:General  Level of Consciousness: sedated  Airway & Oxygen Therapy: Patient connected to nasal cannula oxygen  Post-op Assessment: Report given to RN and Post -op Vital signs reviewed and stable  Post vital signs: Reviewed and stable  Last Vitals:  Vitals Value Taken Time  BP 120/55 03/27/20 2107  Temp    Pulse 87 03/27/20 2111  Resp 15 03/27/20 2111  SpO2 98 % 03/27/20 2111  Vitals shown include unvalidated device data.  Last Pain:  Vitals:   03/27/20 2105  TempSrc:   PainSc: (P) Asleep      Patients Stated Pain Goal: (P) 4 (79/15/04 1364)  Complications: No complications documented.

## 2020-03-27 NOTE — ED Notes (Signed)
Pt's sister  Ennis Forts [phone]    Other sister  Tanya Nones [phone]

## 2020-03-27 NOTE — ED Notes (Signed)
Dr. Saintclair Halsted at bedside
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
## 2020-03-27 NOTE — ED Notes (Signed)
Pt c/o pain just below umbilicus on left.

## 2020-03-27 NOTE — Progress Notes (Signed)
   03/27/20 1200  Clinical Encounter Type  Visited With Patient  Visit Type ED;Trauma   Chaplain engaged in initial visit with Jesse Mcintyre.  Chaplain offered support and Jesse Mcintyre thanked her for coming by and asked for prayers.  Jesse Mcintyre shared that he has contacted his sister and she is aware he is at the hospital but does not know if she is coming to see him.   Chaplain will follow-up as needed.

## 2020-03-27 NOTE — Anesthesia Procedure Notes (Signed)
Procedure Name: Intubation Date/Time: 03/27/2020 6:03 PM Performed by: Clearnce Sorrel, CRNA Pre-anesthesia Checklist: Patient identified, Emergency Drugs available, Suction available, Patient being monitored and Timeout performed Patient Re-evaluated:Patient Re-evaluated prior to induction Oxygen Delivery Method: Circle system utilized Preoxygenation: Pre-oxygenation with 100% oxygen Induction Type: IV induction, Rapid sequence and Cricoid Pressure applied Laryngoscope Size: Mac and 4 Grade View: Grade I Tube type: Oral Tube size: 7.5 mm Number of attempts: 1 Airway Equipment and Method: Stylet Placement Confirmation: ETT inserted through vocal cords under direct vision,  positive ETCO2 and breath sounds checked- equal and bilateral Secured at: 23 cm Tube secured with: Tape Dental Injury: Teeth and Oropharynx as per pre-operative assessment

## 2020-03-27 NOTE — H&P (Signed)
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 muscle hematoma -Dr. Saintclair Halsted has seen the patient and requests hospitalist admission  -He is planning to perform posterior spinal fixation with pedicle screws from T11-L3 this evening -He plans for the patient to be able to mobilize tomorrow -Of note, bleeding risk increased due to heavy use of Goody powders -He will place post-operative orders following the procedure and will continue to follow for this issue -When I discussed the patient with PCCM, and his road rash may lead to electrolyte and other issues - needs ongoing monitoring  Afib -Patient with prior diagnosis of this issue and started on Amio, Dilt, and Eliquis -He picked up 30 day supply in March and took until gone and then did not refill -He voices understanding that afib is a chronic issue and will need ongoing therapy -For now, will admit to telemetry (or as per Dr. 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, currently  draining purulent material -Will treat with IV antibiotics (Rocephin and Flagyl as per the lower extremity wound algorithm) -Will order tib/fib x-ray for now, but may need MRI to determine if osteo is present -This ulcer increases the risk of wound infection in his back, as well -Patient has ongoing smoking history and so I have ordered ABIs in case revascularization may be indicated -LE wound order set utilized including labs (CRP, ESR, A1c, prealbumin, HIV, and blood cultures) and consults (TOC team; peripheral vascular navigator; wound care; and nutrition)  LUL lung mass -Patient was diagnosed with a lung mass prior to March Martin Majestic in for bronch and was  found to be in afib so procedure was postponed -Eventual biopsy took place in early May and was highly suspicious for malignancy but non-diagnostic -He saw Dr. 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 Small left pleural effusion is new since patient's PET CT." -He is scheduled to see [facility] soon as an outpatient, but is likely to benefit from inpatient consultation - will ask them to consult on the patient tomorrow  H/o ETOH dependence -Patient has been in remission for 8 months - praise provided -Ongoing cessation remains important  COPD with ongoing tobacco dependence -Continue Breztri and prn Albuterol -Tobacco Dependence: encourage cessation.   -This was discussed with the patient and should be reviewed on an ongoing basis.   -Patch ordered at patient request.     Note: This patient has been tested and is negative for the novel coronavirus COVID-19.  DVT prophylaxis:  SCDs Code Status:  DNR - confirmed with patient/family Family Communication: Sister was present throughout evaluation Disposition Plan:  The  patient is from: home  Anticipated d/c is to: home with [facility] services or to SNF Anticipated d/c date will depend on clinical response to treatment  Patient is currently: acutely ill Consults called: Neurosurgery; trauma surgery; likely to need pulm, possibly also cardiology; TOC team; peripheral vascular navigator; wound care; and nutrition Admission status:  Admit - It is my clinical opinion that admission to INPATIENT is reasonable and necessary because of the expectation that this patient will require hospital care that crosses at least 2 midnights to treat this condition based on the medical complexity of the problems presented.  Given the aforementioned information, the predictability of an adverse outcome is felt to be significant.    Karmen Bongo MD [facility]   How to contact the [facility] Attending or Consulting provider [place] or covering provider during after hours [place], for this patient?  1. Check the care team in [facility] and look for a) attending/consulting [facility] provider listed and b) the [facility] team listed 2. Log into [email or web address] and use [place]'s universal password to access. If you do not have the password, please contact the hospital operator. 3. Locate the [facility] provider you are looking for under [facility] and page to a number that you can be directly reached. 4. If you still have difficulty reaching the provider, please page the [facility] (Director on Call) for the Hospitalists listed on amion for assistance.   03/27/2020, 5:09 PM

## 2020-03-27 NOTE — Progress Notes (Signed)
Orthopedic Tech Progress Note Patient Details:  Jesse Mcintyre [birth year] [id number] Brace has been ordered  Patient ID: [name], male   DOB: [birth year], 64 y.o.   MRN: [id number]   [name] 03/27/2020, 11:36 PM

## 2020-03-27 NOTE — Progress Notes (Signed)
Orthopedic Tech Progress Note Patient Details:  Jesse Mcintyre 30-Dec-1955 [id number] Level 2 Trauma Patient ID: [name], male   DOB: 24-Oct-1955, 64 y.o.   MRN: [id number]   [name] 03/27/2020, 2:44 PM
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
## 2020-03-27 NOTE — Progress Notes (Signed)
Pre-op assessment completed by CRNA. Unable to perform CHG bath due to multiple abrasions and being ready to go back to OR.
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
## 2020-03-27 NOTE — ED Notes (Signed)
Pt returned from MRI.  Requesting more pain meds for back pain
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
## 2020-03-27 NOTE — ED Notes (Signed)
Pt to MRI at this time.

## 2020-03-27 NOTE — Anesthesia Preprocedure Evaluation (Addendum)
Anesthesia Evaluation  Patient identified by MRN, date of birth, ID band Patient awake    Reviewed: Allergy & Precautions, NPO status , Patient's Chart, lab work & pertinent test results  Airway Mallampati: II  TM Distance: >3 FB Neck ROM: Full    Dental  (+) Edentulous Upper, Edentulous Lower, Dental Advisory Given   Pulmonary Current Smoker,    Pulmonary exam normal breath sounds clear to auscultation       Cardiovascular negative cardio ROS Normal cardiovascular exam Rhythm:Regular Rate:Normal     Neuro/Psych negative neurological ROS  negative psych ROS   GI/Hepatic negative GI ROS, Neg liver ROS,   Endo/Other  negative endocrine ROS  Renal/GU negative Renal ROS     Musculoskeletal negative musculoskeletal ROS (+)   Abdominal   Peds  Hematology negative hematology ROS (+)   Anesthesia Other Findings   Reproductive/Obstetrics negative OB ROS                            Anesthesia Physical Anesthesia Plan  ASA: III and emergent  Anesthesia Plan: General   Post-op Pain Management:    Induction: Intravenous and Rapid sequence  PONV Risk Score and Plan: 3 and Ondansetron, Dexamethasone and Treatment may vary due to age or medical condition  Airway Management Planned: Oral ETT  Additional Equipment:   Intra-op Plan:   Post-operative Plan: Extubation in OR  Informed Consent: I have reviewed the patients History and Physical, chart, labs and discussed the procedure including the risks, benefits and alternatives for the proposed anesthesia with the patient or authorized representative who has indicated his/her understanding and acceptance.     Dental advisory given  Plan Discussed with: CRNA  Anesthesia Plan Comments:        Anesthesia Quick Evaluation

## 2020-03-28 DIAGNOSIS — J449 Chronic obstructive pulmonary disease, unspecified: Secondary | ICD-10-CM

## 2020-03-28 DIAGNOSIS — F17200 Nicotine dependence, unspecified, uncomplicated: Secondary | ICD-10-CM

## 2020-03-28 DIAGNOSIS — R918 Other nonspecific abnormal finding of lung field: Secondary | ICD-10-CM

## 2020-03-28 DIAGNOSIS — S32001A Stable burst fracture of unspecified lumbar vertebra, initial encounter for closed fracture: Secondary | ICD-10-CM

## 2020-03-28 LAB — CBC
HCT: 30.5 % — ABNORMAL LOW (ref 39.0–52.0)
Hemoglobin: 9 g/dL — ABNORMAL LOW (ref 13.0–17.0)
MCH: 20.7 pg — ABNORMAL LOW (ref 26.0–34.0)
MCHC: 29.5 g/dL — ABNORMAL LOW (ref 30.0–36.0)
MCV: 70.3 fL — ABNORMAL LOW (ref 80.0–100.0)
Platelets: 230 10*3/uL (ref 150–400)
RBC: 4.34 MIL/uL (ref 4.22–5.81)
RDW: 22 % — ABNORMAL HIGH (ref 11.5–15.5)
WBC: 10.4 10*3/uL (ref 4.0–10.5)
nRBC: 0 % (ref 0.0–0.2)

## 2020-03-28 LAB — BASIC METABOLIC PANEL
Anion gap: 7 (ref 5–15)
BUN: 5 mg/dL — ABNORMAL LOW (ref 8–23)
CO2: 29 mmol/L (ref 22–32)
Calcium: 8.5 mg/dL — ABNORMAL LOW (ref 8.9–10.3)
Chloride: 101 mmol/L (ref 98–111)
Creatinine, Ser: 0.85 mg/dL (ref 0.61–1.24)
GFR calc Af Amer: >60 mL/min (ref >60)
GFR calc non Af Amer: >60 mL/min (ref >60)
Glucose, Bld: 151 mg/dL — ABNORMAL HIGH (ref 70–99)
Potassium: 4.7 mmol/L (ref 3.5–5.1)
Sodium: 137 mmol/L (ref 135–145)

## 2020-03-28 LAB — HEMOGLOBIN A1C
Hgb A1c MFr Bld: 6.1 % — ABNORMAL HIGH (ref 4.8–5.6)
Mean Plasma Glucose: 128.37 mg/dL

## 2020-03-28 LAB — LACTIC ACID, PLASMA: Lactic Acid, Venous: 1.2 mmol/L (ref 0.5–1.9)

## 2020-03-28 LAB — HIV ANTIBODY (ROUTINE TESTING W REFLEX): HIV Screen 4th Generation wRfx: NONREACTIVE

## 2020-03-28 LAB — C-REACTIVE PROTEIN: CRP: 9 mg/dL — ABNORMAL HIGH (ref <1.0)

## 2020-03-28 LAB — PREALBUMIN: Prealbumin: 11.8 mg/dL — ABNORMAL LOW (ref 18–38)

## 2020-03-28 LAB — ABO/RH: ABO/RH(D): B POS

## 2020-03-28 LAB — MRSA PCR SCREENING: MRSA by PCR: NEGATIVE

## 2020-03-28 LAB — SEDIMENTATION RATE: Sed Rate: 43 mm/hr — ABNORMAL HIGH (ref 0–16)

## 2020-03-28 MED ORDER — ALBUTEROL SULFATE (2.5 MG/3ML) 0.083% IN NEBU
2.5000 mg | INHALATION_SOLUTION | RESPIRATORY_TRACT | Status: DC | PRN
  Administered 2020-03-30 – 2020-03-31 (×2): 2.5 mg via RESPIRATORY_TRACT
  Filled 2020-03-28 (×2): qty 3

## 2020-03-28 MED ORDER — METRONIDAZOLE 500 MG PO TABS
500.0000 mg | ORAL_TABLET | Freq: Three times a day (TID) | ORAL | Status: DC
  Administered 2020-03-28 – 2020-04-06 (×27): 500 mg via ORAL
  Filled 2020-03-28 (×27): qty 1

## 2020-03-28 MED ORDER — PANTOPRAZOLE SODIUM 40 MG PO TBEC
40.0000 mg | DELAYED_RELEASE_TABLET | Freq: Every day | ORAL | Status: DC
  Administered 2020-03-28 – 2020-04-05 (×9): 40 mg via ORAL
  Filled 2020-03-28 (×9): qty 1
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
## 2020-03-28 NOTE — Progress Notes (Signed)
PHARMACIST - PHYSICIAN COMMUNICATION  DR:   Aline August  CONCERNING: Antibiotic and Non-Antibiotic IV to Oral Route Change Policy  RECOMMENDATION: This patient is receiving metronidazole and pantoprazole by the intravenous route.  Based on criteria approved by the Pharmacy and Therapeutics Committee, the antibiotic(s) is/are being converted to the equivalent oral dose form(s).   DESCRIPTION: These criteria include:  Patient being treated for a respiratory tract infection, urinary tract infection, cellulitis or clostridium difficile associated diarrhea if on metronidazole  The patient is not neutropenic and does not exhibit a GI malabsorption state  The patient is eating (either orally or via tube) and/or has been taking other orally administered medications for a least 24 hours  The patient is improving clinically and has a Tmax < 100.5  If you have questions about this conversion, please contact the Pharmacy Department  []   [phone] )  Jesse Mcintyre [x]   [phone] )  Jesse Mcintyre  []   [phone] )  [facility] []   [phone] )  [place], PharmD PGY-1 Acute Care Pharmacy Resident Office: [phone] 03/28/2020 2:19 PM
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
## 2020-03-28 NOTE — Progress Notes (Signed)
Pt c/o sob. Sats in the mid to low 80s.  Pt was noted to expiratory wheezes bilaterally.This nurse attempted to call respiratory with no answer. Pt reported that tat times albuterol sometimes cause pt a headache. This nurse assured the pt we could treat that issue if the need arose.  Albuterol via neb was administered.Pt tolerated well. Wheezes still audible in r lungs but decreased in left. Sats back up in the 90s. Will continue to monitor. Katherina Right RN

## 2020-03-28 NOTE — Progress Notes (Addendum)
Pt admitted to floor via PACU. Pt is A&Ox3, disoriented to situation. Pt was oriented to the room. Vital signs stable. Pt is on 3/L of oxygen via [place]. Honeycomb dressing is clean, dry and intact with hemovac. Bed in the lowest position and bed alarm on.

## 2020-03-28 NOTE — Consult Note (Signed)
WOC Nurse Consult Note: Reason for Consult: Chronic, nonhealing wound of many years duration on LLE Wound type: full thickness Pressure Injury POA: N/A Measurement: two small defects on anterior left LE, pretibial area Wound bed:N/A Drainage (amount, consistency, odor) small amount light yellow exudate with musty odor when periwound  tissue is compressed. Periwound: with evidence of previous wound healing, scarring Dressing procedure/placement/frequency: Patient reports a chronic non healing wound in this area of many years duration and using gauze pads and tape to keep the drainage off of his clothing. He says he has had a number of specialists loss at it and it is not a bone infection (osteomyelitis), but a chronic accumulation of fluid beneath the skin.  I have provided conservative care orders for Nursing to perform while here, using soap and water to cleanse, then saline moistened gauze topped with dry gauze and secured with a silicone foam once daily. LLE is to be elevated while in chair.  A pressure redistribution chair pad is provided for his use when OOB in chair.  [place] nursing team will not follow, but will remain available to this patient, the nursing and medical teams.  Please re-consult if needed. Thanks, Maudie Flakes, MSN, RN, [place], Arther Abbott  Pager# [phone]

## 2020-03-28 NOTE — Progress Notes (Signed)
NEUROSURGERY PROGRESS NOTE  Doing well. Complains of appropriate mild back soreness. No leg pain No numbness, tingling or weakness Good strength and sensation Incision CDI  Temp:  [97.6 F (36.4 C)-98.3 F (36.8 C)] 98.3 F (36.8 C) (07/18 0800) Pulse Rate:  [81-118] 89 (07/18 0800) Resp:  [13-27] 16 (07/18 0434) BP: (95-137)/(55-85) 109/65 (07/18 0800) SpO2:  [89 %-100 %] 96 % (07/18 0800)  Plan: Postop day 1 thoracolumbar fusion for fracture. Will mobilize with therapy today in his brace. Doing very well  Eleonore Chiquito, NP 03/28/2020 9:54 AM
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
## 2020-03-29 ENCOUNTER — Inpatient Hospital Stay (HOSPITAL_COMMUNITY): Payer: Medicare Other | Admitting: Anesthesiology

## 2020-03-29 ENCOUNTER — Encounter (HOSPITAL_COMMUNITY): Payer: Self-pay | Admitting: Internal Medicine

## 2020-03-29 ENCOUNTER — Inpatient Hospital Stay (HOSPITAL_COMMUNITY): Payer: Medicare Other

## 2020-03-29 ENCOUNTER — Encounter (HOSPITAL_COMMUNITY)
Admission: EM | Disposition: A | Discharge status: Skilled Nursing Facility | Payer: Self-pay | Source: Home / Self Care | Attending: Internal Medicine

## 2020-03-29 DIAGNOSIS — L0390 Cellulitis, unspecified: Secondary | ICD-10-CM

## 2020-03-29 HISTORY — PX: BRONCHIAL BRUSHINGS: SHX5108

## 2020-03-29 HISTORY — PX: ENDOBRONCHIAL ULTRASOUND: SHX5096

## 2020-03-29 HISTORY — PX: FIDUCIAL MARKER PLACEMENT: SHX6858

## 2020-03-29 HISTORY — PX: BRONCHIAL NEEDLE ASPIRATION BIOPSY: SHX5106

## 2020-03-29 HISTORY — PX: VIDEO BRONCHOSCOPY: SHX5072

## 2020-03-29 HISTORY — PX: ELECTROMAGNETIC NAVIGATION BROCHOSCOPY: SHX5369

## 2020-03-29 HISTORY — PX: BRONCHIAL BIOPSY: SHX5109

## 2020-03-29 LAB — CBC WITH DIFFERENTIAL/PLATELET
Abs Immature Granulocytes: 0.06 10*3/uL (ref 0.00–0.07)
Basophils Absolute: 0 10*3/uL (ref 0.0–0.1)
Basophils Relative: 0 %
Eosinophils Absolute: 0 10*3/uL (ref 0.0–0.5)
Eosinophils Relative: 0 %
HCT: 26 % — ABNORMAL LOW (ref 39.0–52.0)
Hemoglobin: 7.5 g/dL — ABNORMAL LOW (ref 13.0–17.0)
Immature Granulocytes: 1 %
Lymphocytes Relative: 9 %
Lymphs Abs: 1 10*3/uL (ref 0.7–4.0)
MCH: 20.1 pg — ABNORMAL LOW (ref 26.0–34.0)
MCHC: 28.8 g/dL — ABNORMAL LOW (ref 30.0–36.0)
MCV: 69.7 fL — ABNORMAL LOW (ref 80.0–100.0)
Monocytes Absolute: 1.1 10*3/uL — ABNORMAL HIGH (ref 0.1–1.0)
Monocytes Relative: 10 %
Neutro Abs: 9.2 10*3/uL — ABNORMAL HIGH (ref 1.7–7.7)
Neutrophils Relative %: 80 %
Platelets: 222 10*3/uL (ref 150–400)
RBC: 3.73 MIL/uL — ABNORMAL LOW (ref 4.22–5.81)
RDW: 21.9 % — ABNORMAL HIGH (ref 11.5–15.5)
WBC: 11.4 10*3/uL — ABNORMAL HIGH (ref 4.0–10.5)
nRBC: 0 % (ref 0.0–0.2)

## 2020-03-29 LAB — BASIC METABOLIC PANEL
Anion gap: 8 (ref 5–15)
BUN: 14 mg/dL (ref 8–23)
CO2: 29 mmol/L (ref 22–32)
Calcium: 8.7 mg/dL — ABNORMAL LOW (ref 8.9–10.3)
Chloride: 99 mmol/L (ref 98–111)
Creatinine, Ser: 1.04 mg/dL (ref 0.61–1.24)
GFR calc Af Amer: >60 mL/min (ref >60)
GFR calc non Af Amer: >60 mL/min (ref >60)
Glucose, Bld: 121 mg/dL — ABNORMAL HIGH (ref 70–99)
Potassium: 4.8 mmol/L (ref 3.5–5.1)
Sodium: 136 mmol/L (ref 135–145)

## 2020-03-29 LAB — MAGNESIUM: Magnesium: 2.2 mg/dL (ref 1.7–2.4)

## 2020-03-29 LAB — PROTIME-INR
INR: 1.3 — ABNORMAL HIGH (ref 0.8–1.2)
Prothrombin Time: 15.9 seconds — ABNORMAL HIGH (ref 11.4–15.2)

## 2020-03-29 SURGERY — BRONCHOSCOPY, WITH FLUOROSCOPY
Anesthesia: General

## 2020-03-29 MED ORDER — FENTANYL CITRATE (PF) 100 MCG/2ML IJ SOLN: 25.0000 ug | INTRAMUSCULAR | Status: DC | PRN

## 2020-03-29 MED ORDER — PROPOFOL 10 MG/ML IV BOLUS
INTRAVENOUS | Status: DC | PRN
  Administered 2020-03-29: 150 mg via INTRAVENOUS

## 2020-03-29 MED ORDER — FENTANYL CITRATE (PF) 250 MCG/5ML IJ SOLN
INTRAMUSCULAR | Status: DC | PRN
  Administered 2020-03-29 (×2): 50 ug via INTRAVENOUS

## 2020-03-29 MED ORDER — SUGAMMADEX SODIUM 200 MG/2ML IV SOLN
INTRAVENOUS | Status: DC | PRN
  Administered 2020-03-29: 200 mg via INTRAVENOUS

## 2020-03-29 MED ORDER — PHENYLEPHRINE 40 MCG/ML (10ML) SYRINGE FOR IV PUSH (FOR BLOOD PRESSURE SUPPORT)
PREFILLED_SYRINGE | INTRAVENOUS | Status: DC | PRN
  Administered 2020-03-29: 120 ug via INTRAVENOUS
  Administered 2020-03-29: 80 ug via INTRAVENOUS
  Administered 2020-03-29: 120 ug via INTRAVENOUS

## 2020-03-29 MED ORDER — DEXAMETHASONE SODIUM PHOSPHATE 10 MG/ML IJ SOLN
INTRAMUSCULAR | Status: DC | PRN
  Administered 2020-03-29: 10 mg via INTRAVENOUS

## 2020-03-29 MED ORDER — ROCURONIUM BROMIDE 10 MG/ML (PF) SYRINGE
PREFILLED_SYRINGE | INTRAVENOUS | Status: DC | PRN
  Administered 2020-03-29: 30 mg via INTRAVENOUS
  Administered 2020-03-29: 50 mg via INTRAVENOUS

## 2020-03-29 MED ORDER — ONDANSETRON HCL 4 MG/2ML IJ SOLN: 4.0000 mg | Freq: Once | INTRAMUSCULAR | Status: DC | PRN

## 2020-03-29 MED ORDER — ONDANSETRON HCL 4 MG/2ML IJ SOLN
INTRAMUSCULAR | Status: DC | PRN
  Administered 2020-03-29: 4 mg via INTRAVENOUS

## 2020-03-29 MED ORDER — LIDOCAINE 2% (20 MG/ML) 5 ML SYRINGE
INTRAMUSCULAR | Status: DC | PRN
  Administered 2020-03-29: 100 mg via INTRAVENOUS

## 2020-03-29 MED ORDER — PROSOURCE PLUS PO LIQD
30.0000 mL | Freq: Every day | ORAL | Status: DC
  Administered 2020-03-30 – 2020-04-06 (×7): 30 mL via ORAL
  Filled 2020-03-29 (×8): qty 30

## 2020-03-29 MED ORDER — LACTATED RINGERS IV SOLN: INTRAVENOUS | Status: DC | PRN

## 2020-03-29 MED ORDER — ENSURE ENLIVE PO LIQD
237.0000 mL | Freq: Two times a day (BID) | ORAL | Status: DC
  Administered 2020-03-30 – 2020-04-06 (×15): 237 mL via ORAL

## 2020-03-29 MED ORDER — FENTANYL CITRATE (PF) 100 MCG/2ML IJ SOLN
INTRAMUSCULAR | Status: AC
  Filled 2020-03-29: qty 2

## 2020-03-29 SURGICAL SUPPLY — 1 items: Superlock fiducial marker ×9 IMPLANT

## 2020-03-29 NOTE — Anesthesia Postprocedure Evaluation (Signed)
Anesthesia Post Note  Patient: Jesse Mcintyre  Procedure(s) Performed: VIDEO BRONCHOSCOPY WITH FLUORO (N/A ) ELECTROMAGNETIC NAVIGATION BRONCHOSCOPY (N/A ) ENDOBRONCHIAL ULTRASOUND (N/A ) BRONCHIAL BIOPSIES BRONCHIAL BRUSHINGS BRONCHIAL NEEDLE ASPIRATION BIOPSIES FIDUCIAL MARKER PLACEMENT     Patient location during evaluation: PACU Anesthesia Type: General Level of consciousness: sedated Pain management: pain level controlled Vital Signs Assessment: post-procedure vital signs reviewed and stable Respiratory status: spontaneous breathing and respiratory function stable Cardiovascular status: stable Postop Assessment: no apparent nausea or vomiting Anesthetic complications: no   No complications documented.  Last Vitals:  Vitals:   03/29/20 1615 03/29/20 1628  BP: (!) 140/55   Pulse: 96 79  Resp: 15 20  Temp:  36.6 C  SpO2: 98% 98%    Last Pain:  Vitals:   03/29/20 1600  TempSrc:   PainSc: 0-No pain                 Corneluis Allston DANIEL
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
## 2020-03-29 NOTE — Progress Notes (Signed)
pcxr completed.
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
## 2020-03-29 NOTE — Op Note (Signed)
Video Bronchoscopy with Electromagnetic Navigation Procedure Note  Date of Operation: 03/29/2020  Pre-op Diagnosis: Left upper lobe mass, right upper lobe nodule  Post-op Diagnosis: Same  Surgeon: Baltazar Apo  Assistants: None  Anesthesia: General endotracheal anesthesia  Operation: Flexible video fiberoptic bronchoscopy with electromagnetic navigation and biopsies.  Estimated Blood Loss: Minimal  Complications: None apparent  Indications and History: Jesse Mcintyre is a 64 y.o. male with history of tobacco use who was found to have a left upper lobe mass on imaging.  Also a right upper lobe pulmonary nodule.  Recommendation was made to achieve a tissue diagnosis via navigational bronchoscopy.  The risks, benefits, complications, treatment options and expected outcomes were discussed with the patient.  The possibilities of pneumothorax, pneumonia, reaction to medication, pulmonary aspiration, perforation of a viscus, bleeding, failure to diagnose a condition and creating a complication requiring transfusion or operation were discussed with the patient who freely signed the consent.    Description of Procedure: The patient was seen in the Preoperative Area, was examined and was deemed appropriate to proceed.  The patient was taken to [facility] endoscopy room 2, identified as Jesse Mcintyre and the procedure verified as Flexible Video Fiberoptic Bronchoscopy.  A Time Out was held and the above information confirmed.   Prior to the date of the procedure a high-resolution CT scan of the chest was performed. Utilizing [place] a virtual tracheobronchial tree was generated to allow the creation of distinct navigation pathways to the patient's parenchymal abnormalities. After being taken to the operating room general anesthesia was initiated and the patient  was orally intubated. The video fiberoptic bronchoscope was introduced via the endotracheal tube and a general inspection was  performed which showed normal airways on the right.  The left upper lobe bronchus and the segmental airways were all irregular, erythematous, raised concerning for malignancy. The extendable working channel and locator guide were introduced into the bronchoscope. The distinct navigation pathways prepared prior to this procedure were then utilized to navigate to within 0.3 cm of patient's left upper lobe mass and then within 0.34 cm of the patient's right upper lobe nodule identified on CT scan. The extendable working channel was secured into place at each location and the locator guide was withdrawn. Under fluoroscopic guidance transbronchial needle brushings, transbronchial Wang needle biopsies, and transbronchial forceps biopsies were performed on the left upper lobe mass to be sent for cytology and pathology.  Attention was then turned to the right upper lobe nodule.  Transbronchial needle brushings were performed under fluoroscopic guidance at this location.  A bronchioalveolar lavage was performed in the right upper lobe adjacent to the pulmonary nodule and sent for cytology.  Under fluoroscopic guidance 3 fiducial markers were placed around the right upper lobe nodule triangulating it should radiation therapy be indicated at some point going forward.  At the end of the procedure a general airway inspection was performed and there was no evidence of active bleeding. The bronchoscope was removed.  The patient tolerated the procedure well. There was no significant blood loss and there were no obvious complications. A post-procedural chest x-ray is pending.  Samples: 1. Transbronchial needle brushings from left upper lobe mass 2. Transbronchial Wang needle biopsies from left upper lobe mass 3. Transbronchial forceps biopsies from left upper lobe mass 4. Transbronchial needle brushings from right upper lobe nodule 5. Bronchoalveolar lavage from right upper lobe  Plans:  The patient will be transferred  from the PACU back to his hospital  room when recovered from anesthesia and after chest x-ray is reviewed. We will review the cytology, pathology and results with the patient when they become available.   Baltazar Apo, MD, PhD 03/29/2020, 3:27 PM [facility] [phone] or if no answer [phone]

## 2020-03-29 NOTE — Anesthesia Postprocedure Evaluation (Signed)
Anesthesia Post Note  Patient: Jesse Mcintyre  Procedure(s) Performed: POSTERIOR LUMBAR FUSION with Screws and posterolateral arthrodesis Thoracic Twelve - Lumbar Three (N/A Back)     Patient location during evaluation: PACU Anesthesia Type: General Level of consciousness: sedated and patient cooperative Pain management: pain level controlled Vital Signs Assessment: post-procedure vital signs reviewed and stable Respiratory status: spontaneous breathing Cardiovascular status: stable Anesthetic complications: no   No complications documented.  Last Vitals:  Vitals:   03/29/20 0013 03/29/20 0105  BP: 91/81 100/63  Pulse: 79   Resp: 19   Temp: 36.6 C   SpO2: 97%     Last Pain:  Vitals:   03/29/20 0013  TempSrc: Oral  PainSc:                  Nolon Nations
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
## 2020-03-29 NOTE — Progress Notes (Signed)
Patient did fairly well tonight with a little discomfort not able to have Sprite after MN for procedure today. Dressing change to left lower leg as order with foul odor and some swelling at pinhole site. Patient tolerated dressing change well.

## 2020-03-29 NOTE — Progress Notes (Signed)
Pt has been found sitting on the side of the bed several times during day shift. Each time pt was reminded that the clamshell was needed for pt to sit up. Each time pt decided to lay back down. Pt reports understanding even though pt continues to not follow medical advice. Pt somewhat forgetful since return from bronchoscopy. Katherina Right RN

## 2020-03-29 NOTE — Progress Notes (Signed)
ABI's have been completed. Preliminary results can be found in CV Proc through chart review.   03/29/20 12:18 PM Jesse Mcintyre RVT
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
## 2020-03-29 NOTE — Anesthesia Preprocedure Evaluation (Signed)
Anesthesia Evaluation  Patient identified by MRN, date of birth, ID band Patient awake    Reviewed: Allergy & Precautions, NPO status , Patient's Chart, lab work & pertinent test results  Airway Mallampati: II  TM Distance: >3 FB Neck ROM: Full    Dental  (+) Dental Advisory Given, Edentulous Upper, Edentulous Lower   Pulmonary COPD,  COPD inhaler, Current Smoker and Patient abstained from smoking.,  LUL mass    + wheezing      Cardiovascular Normal cardiovascular exam+ dysrhythmias Atrial Fibrillation  Rhythm:Regular Rate:Normal     Neuro/Psych negative neurological ROS     GI/Hepatic negative GI ROS, (+)     substance abuse  alcohol use,   Endo/Other  negative endocrine ROS  Renal/GU negative Renal ROS     Musculoskeletal negative musculoskeletal ROS (+)   Abdominal   Peds  Hematology negative hematology ROS (+)   Anesthesia Other Findings Day of surgery medications reviewed with the patient.  Reproductive/Obstetrics                             Anesthesia Physical Anesthesia Plan  ASA: III  Anesthesia Plan: General   Post-op Pain Management:    Induction: Intravenous  PONV Risk Score and Plan: 1 and Ondansetron, Treatment may vary due to age or medical condition and Dexamethasone  Airway Management Planned: Oral ETT  Additional Equipment:   Intra-op Plan:   Post-operative Plan: Extubation in OR  Informed Consent: I have reviewed the patients History and Physical, chart, labs and discussed the procedure including the risks, benefits and alternatives for the proposed anesthesia with the patient or authorized representative who has indicated his/her understanding and acceptance.   Patient has DNR.  Discussed DNR with patient and Suspend DNR.   Dental advisory given  Plan Discussed with: CRNA  Anesthesia Plan Comments:         Anesthesia Quick Evaluation

## 2020-03-29 NOTE — Transfer of Care (Signed)
Immediate Anesthesia Transfer of Care Note  Patient: Jesse Mcintyre  Procedure(s) Performed: VIDEO BRONCHOSCOPY WITH FLUORO (N/A ) ELECTROMAGNETIC NAVIGATION BRONCHOSCOPY (N/A ) ENDOBRONCHIAL ULTRASOUND (N/A ) BRONCHIAL BIOPSIES BRONCHIAL BRUSHINGS BRONCHIAL NEEDLE ASPIRATION BIOPSIES FIDUCIAL MARKER PLACEMENT  Patient Location: Endoscopy Unit  Anesthesia Type:General  Level of Consciousness: awake, alert , oriented and patient cooperative  Airway & Oxygen Therapy: Patient Spontanous Breathing and Patient connected to nasal cannula oxygen  Post-op Assessment: Report given to RN and Post -op Vital signs reviewed and stable  Post vital signs: Reviewed and stable  Last Vitals:  Vitals Value Taken Time  BP 123/57 03/29/20 1537  Temp    Pulse 92 03/29/20 1542  Resp 16 03/29/20 1542  SpO2 88 % 03/29/20 1542  Vitals shown include unvalidated device data.  Last Pain:  Vitals:   03/29/20 0924  TempSrc:   PainSc: Asleep      Patients Stated Pain Goal: 4 (03/06/93 8546)  Complications: No complications documented.

## 2020-03-30 ENCOUNTER — Other Ambulatory Visit: Payer: Self-pay

## 2020-03-30 NOTE — Progress Notes (Signed)
Subjective: Patient reports Patient doing well condition of back pain denies any leg pain  Objective: Vital signs in last 24 hours: Temp:  [97.6 F (36.4 C)-98.9 F (37.2 C)] 98.4 F (36.9 C) (07/20 0735) Pulse Rate:  [79-100] 90 (07/20 0735) Resp:  [15-23] 20 (07/20 0735) BP: (95-143)/(39-117) 106/59 (07/20 0735) SpO2:  [91 %-100 %] 98 % (07/20 0735)  Intake/Output from previous day: 07/19 0701 - 07/20 0700 In: 943 [P.O.:240; I.V.:703] Out: 2510 [Urine:2300; Drains:195; Blood:15] Intake/Output this shift: No intake/output data recorded.  Continue to mobilize in his brace now postop day 3 from posterior spinal fusion strength 5-5 wound clean dry and intact  Lab Results: Recent Labs    03/28/20 0014 03/29/20 0350  WBC 10.4 11.4*  HGB 9.0* 7.5*  HCT 30.5* 26.0*  PLT 230 222   BMET Recent Labs    03/28/20 0014 03/29/20 0350  NA 137 136  K 4.7 4.8  CL 101 99  CO2 29 29  GLUCOSE 151* 121*  BUN 5* 14  CREATININE 0.85 1.04  CALCIUM 8.5* 8.7*    Studies/Results: DG Chest Port 1 View  Result Date: 03/29/2020 CLINICAL DATA:  64 year old male status post bronchoscopy and biopsy. EXAM: PORTABLE CHEST 1 VIEW COMPARISON:  Chest radiograph dated 03/27/2020. FINDINGS: There is mild elevation of the left hemidiaphragm similar to prior radiograph with left lung base atelectasis/scarring. There is blunting of the left costophrenic angle as seen on the prior radiograph and CT. Left hilar/suprahilar mass. No new consolidative changes. No pleural effusion or pneumothorax. Several clips noted over the right upper lung field, likely biopsy clips. Stable cardiac silhouette. No acute osseous pathology. IMPRESSION: 1. No pneumothorax. 2. Left hilar/suprahilar mass. Overall no interval change since the radiograph of 03/27/2020. Electronically Signed   By: Anner Crete M.D.   On: 03/29/2020 16:20   VAS [place] ABI WITH/WO TBI  Result Date: 03/29/2020 LOWER EXTREMITY DOPPLER STUDY  Indications: Ulceration. High Risk Factors: None.  Comparison Study: No prior studies. Performing Technologist: Carlos Levering RVT  Examination Guidelines: A complete evaluation includes at minimum, Doppler waveform signals and systolic blood pressure reading at the level of bilateral brachial, anterior tibial, and posterior tibial arteries, when vessel segments are accessible. Bilateral testing is considered an integral part of a complete examination. Photoelectric Plethysmograph (PPG) waveforms and toe systolic pressure readings are included as required and additional duplex testing as needed. Limited examinations for reoccurring indications may be performed as noted.  ABI Findings: +---------+------------------+-----+---------+--------+ Right    Rt Pressure (mmHg)IndexWaveform Comment  +---------+------------------+-----+---------+--------+ Brachial 122                    triphasic         +---------+------------------+-----+---------+--------+ PTA      137               1.12 triphasic         +---------+------------------+-----+---------+--------+ DP       110               0.90 triphasic         +---------+------------------+-----+---------+--------+ Great Toe93                0.76                   +---------+------------------+-----+---------+--------+ +---------+------------------+-----+---------+-------+ Left     Lt Pressure (mmHg)IndexWaveform Comment +---------+------------------+-----+---------+-------+ Brachial 120  triphasic        +---------+------------------+-----+---------+-------+ PTA      145               1.19 biphasic         +---------+------------------+-----+---------+-------+ DP       129               1.06 biphasic         +---------+------------------+-----+---------+-------+ Great Toe61                0.50                  +---------+------------------+-----+---------+-------+  +-------+-----------+-----------+------------+------------+ ABI/TBIToday's ABIToday's TBIPrevious ABIPrevious TBI +-------+-----------+-----------+------------+------------+ Right  1.12       0.76                                +-------+-----------+-----------+------------+------------+ Left   1.19       0.5                                 +-------+-----------+-----------+------------+------------+  Summary: Right: Resting right ankle-brachial index is within normal range. No evidence of significant right lower extremity arterial disease. The right toe-brachial index is normal. Left: Resting left ankle-brachial index is within normal range. No evidence of significant left lower extremity arterial disease. The left toe-brachial index is abnormal.  *See table(s) above for measurements and observations.  Electronically signed by Monica Martinez MD on 03/29/2020 at 2:44:51 PM.    Final    DG C-ARM BRONCHOSCOPY  Result Date: 03/29/2020 C-ARM BRONCHOSCOPY: Fluoroscopy was utilized by the requesting physician.  No radiographic interpretation.    Assessment/Plan: Postop day 3 posterior spinal fusion mobilize with his outpatient therapy in his brace discontinue drain  LOS: 3 days     Elaina Hoops 03/30/2020, 7:46 AM
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
## 2020-03-30 NOTE — TOC CAGE-AID Note (Signed)
Transition of Care (TOC) - CAGE-AID Screening   Patient Details  Name: Jesse Mcintyre MRN: [id number] Date of Birth: 01/31/1956  Transition of Care (TOC) CM/SW Contact:    Miranda  Gainey, LCSWA Phone Number: 03/30/2020, 1:23 PM   Clinical Narrative:  CSW met with pt at bedside. CSW introduced self and explained her role at the hospital.  Pt states that he was an alcoholic in the past but has not had alcohol in over 8 months. Pt stated he was tired of it and was able to stop on his own. Pt denied substance. Pt did not need resources at this time.   CAGE-AID Screening:    Have You Ever Felt You Ought to Cut Down on Your Drinking or Drug Use?: Yes Have People Annoyed You By Critizing Your Drinking Or Drug Use?: No Have You Felt Bad Or Guilty About Your Drinking Or Drug Use?: No Have You Ever Had a Drink or Used Drugs First Thing In The Morning to Steady Your Nerves or to Get Rid of a Hangover?: No CAGE-AID Score: 1  Substance Abuse Education Offered: Yes  Substance abuse interventions: Patient Counseling  Miranda Gainey, LCSWA, LCASA Clinical Social Worker [phone]      

## 2020-03-31 ENCOUNTER — Encounter (HOSPITAL_COMMUNITY): Payer: Self-pay | Admitting: Emergency Medicine

## 2020-03-31 DIAGNOSIS — J431 Panlobular emphysema: Secondary | ICD-10-CM

## 2020-03-31 DIAGNOSIS — T07XXXA Unspecified multiple injuries, initial encounter: Secondary | ICD-10-CM

## 2020-03-31 DIAGNOSIS — Z9889 Other specified postprocedural states: Secondary | ICD-10-CM

## 2020-03-31 LAB — CBC WITH DIFFERENTIAL/PLATELET
Abs Immature Granulocytes: 0.05 10*3/uL (ref 0.00–0.07)
Basophils Absolute: 0 10*3/uL (ref 0.0–0.1)
Basophils Relative: 0 %
Eosinophils Absolute: 0 10*3/uL (ref 0.0–0.5)
Eosinophils Relative: 0 %
HCT: 25.7 % — ABNORMAL LOW (ref 39.0–52.0)
Hemoglobin: 7.4 g/dL — ABNORMAL LOW (ref 13.0–17.0)
Immature Granulocytes: 1 %
Lymphocytes Relative: 10 %
Lymphs Abs: 1 10*3/uL (ref 0.7–4.0)
MCH: 20.4 pg — ABNORMAL LOW (ref 26.0–34.0)
MCHC: 28.8 g/dL — ABNORMAL LOW (ref 30.0–36.0)
MCV: 70.8 fL — ABNORMAL LOW (ref 80.0–100.0)
Monocytes Absolute: 0.9 10*3/uL (ref 0.1–1.0)
Monocytes Relative: 10 %
Neutro Abs: 7.4 10*3/uL (ref 1.7–7.7)
Neutrophils Relative %: 79 %
Platelets: 216 10*3/uL (ref 150–400)
RBC: 3.63 MIL/uL — ABNORMAL LOW (ref 4.22–5.81)
RDW: 21.7 % — ABNORMAL HIGH (ref 11.5–15.5)
WBC: 9.4 10*3/uL (ref 4.0–10.5)
nRBC: 0 % (ref 0.0–0.2)

## 2020-03-31 LAB — BASIC METABOLIC PANEL
Anion gap: 7 (ref 5–15)
BUN: 11 mg/dL (ref 8–23)
CO2: 36 mmol/L — ABNORMAL HIGH (ref 22–32)
Calcium: 8.7 mg/dL — ABNORMAL LOW (ref 8.9–10.3)
Chloride: 96 mmol/L — ABNORMAL LOW (ref 98–111)
Creatinine, Ser: 0.77 mg/dL (ref 0.61–1.24)
GFR calc Af Amer: >60 mL/min (ref >60)
GFR calc non Af Amer: >60 mL/min (ref >60)
Glucose, Bld: 122 mg/dL — ABNORMAL HIGH (ref 70–99)
Potassium: 4.2 mmol/L (ref 3.5–5.1)
Sodium: 139 mmol/L (ref 135–145)

## 2020-03-31 LAB — MAGNESIUM: Magnesium: 1.9 mg/dL (ref 1.7–2.4)

## 2020-03-31 NOTE — TOC Progression Note (Addendum)
Transition of Care [facility]) - Progression Note    Patient Details  Name: Jesse Mcintyre MRN: [id number] Date of Birth: 30-Apr-1956  Transition of Care [facility]) CM/SW [place], [place] Phone Number: 03/31/2020, 11:22 AM  Clinical Narrative:     Called [facility]/SNF- left voice message with [place]/Admissions Coordinator - needs to confirm bed offer.   DNR placed on patient's chart for MD signature.  Thurmond Butts, MSW, [place] Clinical Social Worker   Expected Discharge Plan: Skilled Nursing Facility Barriers to Discharge: Continued Medical Work up  Expected Discharge Plan and Services Expected Discharge Plan: [place] In-house Referral: Clinical Social Work     Living arrangements for the past 2 months: Group Home                                       Social Determinants of Health (SDOH) Interventions    Readmission Risk Interventions No flowsheet data found.
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
## 2020-03-31 NOTE — Progress Notes (Signed)
Subjective: Patient reports some back soreness, legs feel great  Objective: Vital signs in last 24 hours: Temp:  [97.6 F (36.4 C)-98.6 F (37 C)] 98 F (36.7 C) (07/21 1149) Pulse Rate:  [68-87] 80 (07/21 1149) Resp:  [16-18] 18 (07/21 1149) BP: (96-110)/(54-63) 96/63 (07/21 1149) SpO2:  [91 %-97 %] 94 % (07/21 1321)  Intake/Output from previous day: 07/20 0701 - 07/21 0700 In: 366 [P.O.:360; I.V.:6] Out: 2602 [Urine:2600; Stool:2] Intake/Output this shift: Total I/O In: 780 [P.O.:780] Out: 800 [Urine:800]  Neurologic: Grossly normal  Lab Results: Lab Results  Component Value Date   WBC 9.4 03/31/2020   HGB 7.4 (L) 03/31/2020   HCT 25.7 (L) 03/31/2020   MCV 70.8 (L) 03/31/2020   PLT 216 03/31/2020   Lab Results  Component Value Date   INR 1.3 (H) 03/29/2020   BMET Lab Results  Component Value Date   NA 139 03/31/2020   K 4.2 03/31/2020   CL 96 (L) 03/31/2020   CO2 36 (H) 03/31/2020   GLUCOSE 122 (H) 03/31/2020   BUN 11 03/31/2020   CREATININE 0.77 03/31/2020   CALCIUM 8.7 (L) 03/31/2020    Studies/Results: DG Chest Port 1 View  Result Date: 03/29/2020 CLINICAL DATA:  64 year old male status post bronchoscopy and biopsy. EXAM: PORTABLE CHEST 1 VIEW COMPARISON:  Chest radiograph dated 03/27/2020. FINDINGS: There is mild elevation of the left hemidiaphragm similar to prior radiograph with left lung base atelectasis/scarring. There is blunting of the left costophrenic angle as seen on the prior radiograph and CT. Left hilar/suprahilar mass. No new consolidative changes. No pleural effusion or pneumothorax. Several clips noted over the right upper lung field, likely biopsy clips. Stable cardiac silhouette. No acute osseous pathology. IMPRESSION: 1. No pneumothorax. 2. Left hilar/suprahilar mass. Overall no interval change since the radiograph of 03/27/2020. Electronically Signed   By: Anner Crete M.D.   On: 03/29/2020 16:20   DG C-ARM BRONCHOSCOPY  Result  Date: 03/29/2020 C-ARM BRONCHOSCOPY: Fluoroscopy was utilized by the requesting physician.  No radiographic interpretation.    Assessment/Plan: Continue to ambulate and mobilize today. No new recom   LOS: 4 days    Ocie Cornfield [facility] 03/31/2020, 2:54 PM

## 2020-04-01 ENCOUNTER — Institutional Professional Consult (permissible substitution): Payer: Medicare Other | Admitting: Internal Medicine

## 2020-04-01 ENCOUNTER — Encounter (HOSPITAL_COMMUNITY): Payer: Self-pay | Admitting: Internal Medicine

## 2020-04-01 LAB — CBC WITH DIFFERENTIAL/PLATELET
Abs Immature Granulocytes: 0.03 10*3/uL (ref 0.00–0.07)
Basophils Absolute: 0 10*3/uL (ref 0.0–0.1)
Basophils Relative: 0 %
Eosinophils Absolute: 0.1 10*3/uL (ref 0.0–0.5)
Eosinophils Relative: 1 %
HCT: 25.3 % — ABNORMAL LOW (ref 39.0–52.0)
Hemoglobin: 7.4 g/dL — ABNORMAL LOW (ref 13.0–17.0)
Immature Granulocytes: 0 %
Lymphocytes Relative: 20 %
Lymphs Abs: 1.5 10*3/uL (ref 0.7–4.0)
MCH: 20.8 pg — ABNORMAL LOW (ref 26.0–34.0)
MCHC: 29.2 g/dL — ABNORMAL LOW (ref 30.0–36.0)
MCV: 71.3 fL — ABNORMAL LOW (ref 80.0–100.0)
Monocytes Absolute: 0.9 10*3/uL (ref 0.1–1.0)
Monocytes Relative: 12 %
Neutro Abs: 4.7 10*3/uL (ref 1.7–7.7)
Neutrophils Relative %: 67 %
Platelets: 226 10*3/uL (ref 150–400)
RBC: 3.55 MIL/uL — ABNORMAL LOW (ref 4.22–5.81)
RDW: 21.8 % — ABNORMAL HIGH (ref 11.5–15.5)
WBC: 7.2 10*3/uL (ref 4.0–10.5)
nRBC: 0 % (ref 0.0–0.2)

## 2020-04-01 LAB — PHOSPHORUS: Phosphorus: 2.4 mg/dL — ABNORMAL LOW (ref 2.5–4.6)

## 2020-04-01 LAB — FOLATE: Folate: 6.5 ng/mL (ref >5.9)

## 2020-04-01 LAB — COMPREHENSIVE METABOLIC PANEL
ALT: 23 U/L (ref 0–44)
AST: 26 U/L (ref 15–41)
Albumin: 2.8 g/dL — ABNORMAL LOW (ref 3.5–5.0)
Alkaline Phosphatase: 48 U/L (ref 38–126)
Anion gap: 8 (ref 5–15)
BUN: 10 mg/dL (ref 8–23)
CO2: 35 mmol/L — ABNORMAL HIGH (ref 22–32)
Calcium: 8.6 mg/dL — ABNORMAL LOW (ref 8.9–10.3)
Chloride: 96 mmol/L — ABNORMAL LOW (ref 98–111)
Creatinine, Ser: 0.83 mg/dL (ref 0.61–1.24)
GFR calc Af Amer: >60 mL/min (ref >60)
GFR calc non Af Amer: >60 mL/min (ref >60)
Glucose, Bld: 94 mg/dL (ref 70–99)
Potassium: 3.7 mmol/L (ref 3.5–5.1)
Sodium: 139 mmol/L (ref 135–145)
Total Bilirubin: 0.7 mg/dL (ref 0.3–1.2)
Total Protein: 6.2 g/dL — ABNORMAL LOW (ref 6.5–8.1)

## 2020-04-01 LAB — VITAMIN B12: Vitamin B-12: 246 pg/mL (ref 180–914)

## 2020-04-01 LAB — IRON AND TIBC
Iron: 26 ug/dL — ABNORMAL LOW (ref 45–182)
Saturation Ratios: 7 % — ABNORMAL LOW (ref 17.9–39.5)
TIBC: 365 ug/dL (ref 250–450)
UIBC: 339 ug/dL

## 2020-04-01 LAB — RETICULOCYTES
Immature Retic Fract: 27.8 % — ABNORMAL HIGH (ref 2.3–15.9)
RBC.: 3.6 MIL/uL — ABNORMAL LOW (ref 4.22–5.81)
Retic Count, Absolute: 74.9 10*3/uL (ref 19.0–186.0)
Retic Ct Pct: 2.1 % (ref 0.4–3.1)

## 2020-04-01 LAB — FERRITIN: Ferritin: 36 ng/mL (ref 24–336)

## 2020-04-01 LAB — MAGNESIUM: Magnesium: 1.8 mg/dL (ref 1.7–2.4)

## 2020-04-01 MED ORDER — SODIUM CHLORIDE 0.9 % IV SOLN
25.0000 mg | Freq: Once | INTRAVENOUS | Status: AC
  Administered 2020-04-01: 25 mg via INTRAVENOUS
  Filled 2020-04-01: qty 0.5

## 2020-04-01 MED ORDER — ASCORBIC ACID 500 MG PO TABS
500.0000 mg | ORAL_TABLET | Freq: Every day | ORAL | Status: DC
  Administered 2020-04-01 – 2020-04-06 (×6): 500 mg via ORAL
  Filled 2020-04-01 (×6): qty 1

## 2020-04-01 MED ORDER — SODIUM CHLORIDE 0.9 % IV SOLN
1000.0000 mg | Freq: Once | INTRAVENOUS | Status: AC
  Administered 2020-04-01: 1000 mg via INTRAVENOUS
  Filled 2020-04-01: qty 20

## 2020-04-01 MED ORDER — FERROUS SULFATE 325 (65 FE) MG PO TABS
325.0000 mg | ORAL_TABLET | Freq: Every day | ORAL | Status: DC
  Administered 2020-04-02 – 2020-04-06 (×5): 325 mg via ORAL
  Filled 2020-04-01 (×6): qty 1

## 2020-04-01 MED ORDER — SODIUM CHLORIDE 0.9 % IV SOLN: 1000.0000 mg | Freq: Once | INTRAVENOUS | Status: DC

## 2020-04-01 MED FILL — Heparin Sodium (Porcine) Inj 1000 Unit/ML: INTRAMUSCULAR | Qty: 30 | Status: AC

## 2020-04-01 MED FILL — Sodium Chloride IV Soln 0.9%: INTRAVENOUS | Qty: 1000 | Status: AC
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
## 2020-04-02 DIAGNOSIS — R42 Dizziness and giddiness: Secondary | ICD-10-CM

## 2020-04-02 DIAGNOSIS — R630 Anorexia: Secondary | ICD-10-CM

## 2020-04-02 DIAGNOSIS — C3412 Malignant neoplasm of upper lobe, left bronchus or lung: Secondary | ICD-10-CM

## 2020-04-02 DIAGNOSIS — D509 Iron deficiency anemia, unspecified: Secondary | ICD-10-CM

## 2020-04-02 DIAGNOSIS — I4891 Unspecified atrial fibrillation: Secondary | ICD-10-CM

## 2020-04-02 DIAGNOSIS — J90 Pleural effusion, not elsewhere classified: Secondary | ICD-10-CM

## 2020-04-02 DIAGNOSIS — Z87891 Personal history of nicotine dependence: Secondary | ICD-10-CM

## 2020-04-02 DIAGNOSIS — S32029A Unspecified fracture of second lumbar vertebra, initial encounter for closed fracture: Secondary | ICD-10-CM

## 2020-04-02 DIAGNOSIS — F1021 Alcohol dependence, in remission: Secondary | ICD-10-CM

## 2020-04-02 DIAGNOSIS — Z7901 Long term (current) use of anticoagulants: Secondary | ICD-10-CM

## 2020-04-02 DIAGNOSIS — L97929 Non-pressure chronic ulcer of unspecified part of left lower leg with unspecified severity: Secondary | ICD-10-CM

## 2020-04-02 DIAGNOSIS — S32019A Unspecified fracture of first lumbar vertebra, initial encounter for closed fracture: Secondary | ICD-10-CM

## 2020-04-02 LAB — CBC WITH DIFFERENTIAL/PLATELET
Abs Immature Granulocytes: 0.04 10*3/uL (ref 0.00–0.07)
Basophils Absolute: 0 10*3/uL (ref 0.0–0.1)
Basophils Relative: 0 %
Eosinophils Absolute: 0.5 10*3/uL (ref 0.0–0.5)
Eosinophils Relative: 5 %
HCT: 27.7 % — ABNORMAL LOW (ref 39.0–52.0)
Hemoglobin: 8.4 g/dL — ABNORMAL LOW (ref 13.0–17.0)
Immature Granulocytes: 0 %
Lymphocytes Relative: 17 %
Lymphs Abs: 1.5 10*3/uL (ref 0.7–4.0)
MCH: 21.2 pg — ABNORMAL LOW (ref 26.0–34.0)
MCHC: 30.3 g/dL (ref 30.0–36.0)
MCV: 69.9 fL — ABNORMAL LOW (ref 80.0–100.0)
Monocytes Absolute: 1 10*3/uL (ref 0.1–1.0)
Monocytes Relative: 11 %
Neutro Abs: 5.9 10*3/uL (ref 1.7–7.7)
Neutrophils Relative %: 67 %
Platelets: 249 10*3/uL (ref 150–400)
RBC: 3.96 MIL/uL — ABNORMAL LOW (ref 4.22–5.81)
RDW: 22.2 % — ABNORMAL HIGH (ref 11.5–15.5)
WBC: 8.9 10*3/uL (ref 4.0–10.5)
nRBC: 0.3 % — ABNORMAL HIGH (ref 0.0–0.2)

## 2020-04-02 LAB — CULTURE, BLOOD (ROUTINE X 2)
Culture: NO GROWTH
Culture: NO GROWTH
Special Requests: ADEQUATE
Special Requests: ADEQUATE

## 2020-04-02 LAB — CYTOLOGY - NON PAP

## 2020-04-02 LAB — COMPREHENSIVE METABOLIC PANEL
ALT: 19 U/L (ref 0–44)
AST: 21 U/L (ref 15–41)
Albumin: 2.6 g/dL — ABNORMAL LOW (ref 3.5–5.0)
Alkaline Phosphatase: 48 U/L (ref 38–126)
Anion gap: 8 (ref 5–15)
BUN: 10 mg/dL (ref 8–23)
CO2: 32 mmol/L (ref 22–32)
Calcium: 8.7 mg/dL — ABNORMAL LOW (ref 8.9–10.3)
Chloride: 97 mmol/L — ABNORMAL LOW (ref 98–111)
Creatinine, Ser: 0.69 mg/dL (ref 0.61–1.24)
GFR calc Af Amer: >60 mL/min (ref >60)
GFR calc non Af Amer: >60 mL/min (ref >60)
Glucose, Bld: 98 mg/dL (ref 70–99)
Potassium: 3.7 mmol/L (ref 3.5–5.1)
Sodium: 137 mmol/L (ref 135–145)
Total Bilirubin: 0.5 mg/dL (ref 0.3–1.2)
Total Protein: 6.5 g/dL (ref 6.5–8.1)

## 2020-04-02 LAB — SURGICAL PATHOLOGY

## 2020-04-02 LAB — MAGNESIUM: Magnesium: 2.1 mg/dL (ref 1.7–2.4)

## 2020-04-02 LAB — PHOSPHORUS: Phosphorus: 3 mg/dL (ref 2.5–4.6)

## 2020-04-02 MED ORDER — CEFIXIME 400 MG PO CAPS
400.0000 mg | ORAL_CAPSULE | Freq: Every day | ORAL | Status: DC
  Administered 2020-04-03 – 2020-04-06 (×4): 400 mg via ORAL
  Filled 2020-04-02 (×4): qty 1

## 2020-04-02 MED ORDER — APIXABAN 5 MG PO TABS
5.0000 mg | ORAL_TABLET | Freq: Two times a day (BID) | ORAL | Status: DC
  Administered 2020-04-02 – 2020-04-06 (×9): 5 mg via ORAL
  Filled 2020-04-02 (×9): qty 1

## 2020-04-02 NOTE — Progress Notes (Signed)
Subjective: Patient reports mild back soreness, no complaints of pain   Objective: Vital signs in last 24 hours: Temp:  [97.7 F (36.5 C)-98.6 F (37 C)] 98.2 F (36.8 C) (07/23 0753) Pulse Rate:  [92-102] 93 (07/23 0753) Resp:  [15-18] 16 (07/23 0753) BP: (100-117)/(50-85) 100/62 (07/23 0753) SpO2:  [93 %-100 %] 93 % (07/23 0753)  Intake/Output from previous day: 07/22 0701 - 07/23 0700 In: 915.1 [P.O.:800; IV Piggyback:115.1] Out: 1750 [Urine:1750] Intake/Output this shift: Total I/O In: 400 [P.O.:400] Out: 300 [Urine:300]  Neurologic: Grossly normal  Lab Results: Lab Results  Component Value Date   WBC 8.9 04/02/2020   HGB 8.4 (L) 04/02/2020   HCT 27.7 (L) 04/02/2020   MCV 69.9 (L) 04/02/2020   PLT 249 04/02/2020   Lab Results  Component Value Date   INR 1.3 (H) 03/29/2020   BMET Lab Results  Component Value Date   NA 137 04/02/2020   K 3.7 04/02/2020   CL 97 (L) 04/02/2020   CO2 32 04/02/2020   GLUCOSE 98 04/02/2020   BUN 10 04/02/2020   CREATININE 0.69 04/02/2020   CALCIUM 8.7 (L) 04/02/2020    Studies/Results: No results found.  Assessment/Plan: Continue therapies today. Awaiting recommendations from oncology about further treatment for lung cancer per hospitalist note. No new nsgy recom.   LOS: 6 days    Ocie Cornfield [facility] 04/02/2020, 10:53 AM
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
## 2020-04-03 LAB — CBC WITH DIFFERENTIAL/PLATELET
Abs Immature Granulocytes: 0.03 10*3/uL (ref 0.00–0.07)
Basophils Absolute: 0 10*3/uL (ref 0.0–0.1)
Basophils Relative: 0 %
Eosinophils Absolute: 0.5 10*3/uL (ref 0.0–0.5)
Eosinophils Relative: 5 %
HCT: 27.5 % — ABNORMAL LOW (ref 39.0–52.0)
Hemoglobin: 8.2 g/dL — ABNORMAL LOW (ref 13.0–17.0)
Immature Granulocytes: 0 %
Lymphocytes Relative: 13 %
Lymphs Abs: 1.1 10*3/uL (ref 0.7–4.0)
MCH: 21 pg — ABNORMAL LOW (ref 26.0–34.0)
MCHC: 29.8 g/dL — ABNORMAL LOW (ref 30.0–36.0)
MCV: 70.3 fL — ABNORMAL LOW (ref 80.0–100.0)
Monocytes Absolute: 1.1 10*3/uL — ABNORMAL HIGH (ref 0.1–1.0)
Monocytes Relative: 13 %
Neutro Abs: 5.8 10*3/uL (ref 1.7–7.7)
Neutrophils Relative %: 69 %
Platelets: 269 10*3/uL (ref 150–400)
RBC: 3.91 MIL/uL — ABNORMAL LOW (ref 4.22–5.81)
RDW: 22.5 % — ABNORMAL HIGH (ref 11.5–15.5)
WBC: 8.5 10*3/uL (ref 4.0–10.5)
nRBC: 0.5 % — ABNORMAL HIGH (ref 0.0–0.2)

## 2020-04-03 LAB — COMPREHENSIVE METABOLIC PANEL
ALT: 18 U/L (ref 0–44)
AST: 19 U/L (ref 15–41)
Albumin: 2.6 g/dL — ABNORMAL LOW (ref 3.5–5.0)
Alkaline Phosphatase: 50 U/L (ref 38–126)
Anion gap: 8 (ref 5–15)
BUN: 8 mg/dL (ref 8–23)
CO2: 32 mmol/L (ref 22–32)
Calcium: 8.9 mg/dL (ref 8.9–10.3)
Chloride: 97 mmol/L — ABNORMAL LOW (ref 98–111)
Creatinine, Ser: 0.73 mg/dL (ref 0.61–1.24)
GFR calc Af Amer: >60 mL/min (ref >60)
GFR calc non Af Amer: >60 mL/min (ref >60)
Glucose, Bld: 111 mg/dL — ABNORMAL HIGH (ref 70–99)
Potassium: 4 mmol/L (ref 3.5–5.1)
Sodium: 137 mmol/L (ref 135–145)
Total Bilirubin: 0.8 mg/dL (ref 0.3–1.2)
Total Protein: 6 g/dL — ABNORMAL LOW (ref 6.5–8.1)

## 2020-04-03 LAB — MAGNESIUM: Magnesium: 2.2 mg/dL (ref 1.7–2.4)

## 2020-04-03 LAB — PHOSPHORUS: Phosphorus: 3.1 mg/dL (ref 2.5–4.6)
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
## 2020-04-03 NOTE — Discharge Instructions (Signed)
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
## 2020-04-04 LAB — CBC WITH DIFFERENTIAL/PLATELET
Abs Immature Granulocytes: 0.04 10*3/uL (ref 0.00–0.07)
Basophils Absolute: 0 10*3/uL (ref 0.0–0.1)
Basophils Relative: 0 %
Eosinophils Absolute: 0.4 10*3/uL (ref 0.0–0.5)
Eosinophils Relative: 5 %
HCT: 28 % — ABNORMAL LOW (ref 39.0–52.0)
Hemoglobin: 8.3 g/dL — ABNORMAL LOW (ref 13.0–17.0)
Immature Granulocytes: 0 %
Lymphocytes Relative: 13 %
Lymphs Abs: 1.2 10*3/uL (ref 0.7–4.0)
MCH: 20.8 pg — ABNORMAL LOW (ref 26.0–34.0)
MCHC: 29.6 g/dL — ABNORMAL LOW (ref 30.0–36.0)
MCV: 70.2 fL — ABNORMAL LOW (ref 80.0–100.0)
Monocytes Absolute: 1.1 10*3/uL — ABNORMAL HIGH (ref 0.1–1.0)
Monocytes Relative: 12 %
Neutro Abs: 6.3 10*3/uL (ref 1.7–7.7)
Neutrophils Relative %: 70 %
Platelets: 289 10*3/uL (ref 150–400)
RBC: 3.99 MIL/uL — ABNORMAL LOW (ref 4.22–5.81)
RDW: 23.2 % — ABNORMAL HIGH (ref 11.5–15.5)
WBC: 9.1 10*3/uL (ref 4.0–10.5)
nRBC: 0.3 % — ABNORMAL HIGH (ref 0.0–0.2)

## 2020-04-04 LAB — COMPREHENSIVE METABOLIC PANEL
ALT: 18 U/L (ref 0–44)
AST: 22 U/L (ref 15–41)
Albumin: 2.7 g/dL — ABNORMAL LOW (ref 3.5–5.0)
Alkaline Phosphatase: 50 U/L (ref 38–126)
Anion gap: 8 (ref 5–15)
BUN: 5 mg/dL — ABNORMAL LOW (ref 8–23)
CO2: 30 mmol/L (ref 22–32)
Calcium: 8.7 mg/dL — ABNORMAL LOW (ref 8.9–10.3)
Chloride: 99 mmol/L (ref 98–111)
Creatinine, Ser: 0.64 mg/dL (ref 0.61–1.24)
GFR calc Af Amer: >60 mL/min (ref >60)
GFR calc non Af Amer: >60 mL/min (ref >60)
Glucose, Bld: 116 mg/dL — ABNORMAL HIGH (ref 70–99)
Potassium: 3.8 mmol/L (ref 3.5–5.1)
Sodium: 137 mmol/L (ref 135–145)
Total Bilirubin: 0.8 mg/dL (ref 0.3–1.2)
Total Protein: 6.4 g/dL — ABNORMAL LOW (ref 6.5–8.1)

## 2020-04-04 LAB — PHOSPHORUS: Phosphorus: 3 mg/dL (ref 2.5–4.6)

## 2020-04-04 LAB — MAGNESIUM: Magnesium: 2.1 mg/dL (ref 1.7–2.4)

## 2020-04-04 NOTE — Progress Notes (Signed)
Patient ID: Jesse Mcintyre, male   DOB: Apr 05, 1956, 64 y.o.   MRN: [id number]  BP 96/69 (BP Location: Left Arm)   Pulse 88   Temp 98.4 F (36.9 C) (Oral)   Resp 20   Ht 5\' 10"  (1.778 m)   Wt 83.9 kg   SpO2 100%   BMI 26.54 kg/m   Alert and oriented x4 Full strength lower extremities  Wound is clean, dry. No signs of infection Doing well
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
## 2020-04-05 ENCOUNTER — Encounter: Payer: Self-pay | Admitting: *Deleted

## 2020-04-05 DIAGNOSIS — R918 Other nonspecific abnormal finding of lung field: Secondary | ICD-10-CM

## 2020-04-05 LAB — CBC WITH DIFFERENTIAL/PLATELET
Abs Immature Granulocytes: 0.07 10*3/uL (ref 0.00–0.07)
Basophils Absolute: 0 10*3/uL (ref 0.0–0.1)
Basophils Relative: 0 %
Eosinophils Absolute: 0.3 10*3/uL (ref 0.0–0.5)
Eosinophils Relative: 3 %
HCT: 29.6 % — ABNORMAL LOW (ref 39.0–52.0)
Hemoglobin: 8.6 g/dL — ABNORMAL LOW (ref 13.0–17.0)
Immature Granulocytes: 1 %
Lymphocytes Relative: 10 %
Lymphs Abs: 0.9 10*3/uL (ref 0.7–4.0)
MCH: 20.9 pg — ABNORMAL LOW (ref 26.0–34.0)
MCHC: 29.1 g/dL — ABNORMAL LOW (ref 30.0–36.0)
MCV: 71.8 fL — ABNORMAL LOW (ref 80.0–100.0)
Monocytes Absolute: 1.1 10*3/uL — ABNORMAL HIGH (ref 0.1–1.0)
Monocytes Relative: 12 %
Neutro Abs: 6.8 10*3/uL (ref 1.7–7.7)
Neutrophils Relative %: 74 %
Platelets: 292 10*3/uL (ref 150–400)
RBC: 4.12 MIL/uL — ABNORMAL LOW (ref 4.22–5.81)
RDW: 24.3 % — ABNORMAL HIGH (ref 11.5–15.5)
WBC: 9.2 10*3/uL (ref 4.0–10.5)
nRBC: 0 % (ref 0.0–0.2)

## 2020-04-05 LAB — SARS CORONAVIRUS 2 (TAT 6-24 HRS): SARS Coronavirus 2: NEGATIVE

## 2020-04-05 LAB — COMPREHENSIVE METABOLIC PANEL
ALT: 23 U/L (ref 0–44)
AST: 30 U/L (ref 15–41)
Albumin: 2.8 g/dL — ABNORMAL LOW (ref 3.5–5.0)
Alkaline Phosphatase: 52 U/L (ref 38–126)
Anion gap: 10 (ref 5–15)
BUN: 5 mg/dL — ABNORMAL LOW (ref 8–23)
CO2: 29 mmol/L (ref 22–32)
Calcium: 8.9 mg/dL (ref 8.9–10.3)
Chloride: 99 mmol/L (ref 98–111)
Creatinine, Ser: 0.74 mg/dL (ref 0.61–1.24)
GFR calc Af Amer: >60 mL/min (ref >60)
GFR calc non Af Amer: >60 mL/min (ref >60)
Glucose, Bld: 112 mg/dL — ABNORMAL HIGH (ref 70–99)
Potassium: 3.9 mmol/L (ref 3.5–5.1)
Sodium: 138 mmol/L (ref 135–145)
Total Bilirubin: 0.8 mg/dL (ref 0.3–1.2)
Total Protein: 6.5 g/dL (ref 6.5–8.1)

## 2020-04-05 LAB — MAGNESIUM: Magnesium: 1.9 mg/dL (ref 1.7–2.4)

## 2020-04-05 LAB — PHOSPHORUS: Phosphorus: 3.6 mg/dL (ref 2.5–4.6)
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
## 2020-04-05 NOTE — Progress Notes (Signed)
I received a message today to refer Jesse Mcintyre to [facility].  Referral completed. I will updated [place] nurse navigator on referral.

## 2020-04-05 NOTE — Progress Notes (Signed)
Spoke with Karsten Fells with [facility] oncology- appointment made for patient to f/u with Dr. Baird Cancer there at the [facility] for August 12 at 3:00pm. They will also reach out to [place] regarding transportation to the appointment. Info has been placed on AVS
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
## 2020-04-06 DIAGNOSIS — T1490XA Injury, unspecified, initial encounter: Secondary | ICD-10-CM

## 2020-04-06 LAB — COMPREHENSIVE METABOLIC PANEL
ALT: 30 U/L (ref 0–44)
AST: 39 U/L (ref 15–41)
Albumin: 2.8 g/dL — ABNORMAL LOW (ref 3.5–5.0)
Alkaline Phosphatase: 62 U/L (ref 38–126)
Anion gap: 9 (ref 5–15)
BUN: 5 mg/dL — ABNORMAL LOW (ref 8–23)
CO2: 28 mmol/L (ref 22–32)
Calcium: 8.7 mg/dL — ABNORMAL LOW (ref 8.9–10.3)
Chloride: 98 mmol/L (ref 98–111)
Creatinine, Ser: 0.63 mg/dL (ref 0.61–1.24)
GFR calc Af Amer: >60 mL/min (ref >60)
GFR calc non Af Amer: >60 mL/min (ref >60)
Glucose, Bld: 106 mg/dL — ABNORMAL HIGH (ref 70–99)
Potassium: 3.8 mmol/L (ref 3.5–5.1)
Sodium: 135 mmol/L (ref 135–145)
Total Bilirubin: 0.8 mg/dL (ref 0.3–1.2)
Total Protein: 6.6 g/dL (ref 6.5–8.1)

## 2020-04-06 LAB — CBC WITH DIFFERENTIAL/PLATELET
Abs Immature Granulocytes: 0.07 10*3/uL (ref 0.00–0.07)
Basophils Absolute: 0 10*3/uL (ref 0.0–0.1)
Basophils Relative: 0 %
Eosinophils Absolute: 0.4 10*3/uL (ref 0.0–0.5)
Eosinophils Relative: 4 %
HCT: 30.5 % — ABNORMAL LOW (ref 39.0–52.0)
Hemoglobin: 8.8 g/dL — ABNORMAL LOW (ref 13.0–17.0)
Immature Granulocytes: 1 %
Lymphocytes Relative: 11 %
Lymphs Abs: 1.1 10*3/uL (ref 0.7–4.0)
MCH: 21 pg — ABNORMAL LOW (ref 26.0–34.0)
MCHC: 28.9 g/dL — ABNORMAL LOW (ref 30.0–36.0)
MCV: 72.8 fL — ABNORMAL LOW (ref 80.0–100.0)
Monocytes Absolute: 1.5 10*3/uL — ABNORMAL HIGH (ref 0.1–1.0)
Monocytes Relative: 15 %
Neutro Abs: 7 10*3/uL (ref 1.7–7.7)
Neutrophils Relative %: 69 %
Platelets: 300 10*3/uL (ref 150–400)
RBC: 4.19 MIL/uL — ABNORMAL LOW (ref 4.22–5.81)
RDW: 25.3 % — ABNORMAL HIGH (ref 11.5–15.5)
WBC: 10.1 10*3/uL (ref 4.0–10.5)
nRBC: 0 % (ref 0.0–0.2)

## 2020-04-06 LAB — MAGNESIUM: Magnesium: 1.9 mg/dL (ref 1.7–2.4)

## 2020-04-06 LAB — PHOSPHORUS: Phosphorus: 3.2 mg/dL (ref 2.5–4.6)

## 2020-04-06 MED ORDER — DILTIAZEM HCL 30 MG PO TABS: 30.0000 mg | ORAL_TABLET | Freq: Four times a day (QID) | ORAL | 0 refills | Status: DC

## 2020-04-06 MED ORDER — ONDANSETRON HCL 4 MG PO TABS: 4.0000 mg | ORAL_TABLET | Freq: Four times a day (QID) | ORAL | 0 refills | Status: DC | PRN

## 2020-04-06 MED ORDER — OXYCODONE HCL 10 MG PO TABS: 10.0000 mg | ORAL_TABLET | ORAL | 0 refills | Status: DC | PRN

## 2020-04-06 MED ORDER — ZOLPIDEM TARTRATE 5 MG PO TABS: 5.0000 mg | ORAL_TABLET | Freq: Every evening | ORAL | 0 refills | Status: DC | PRN

## 2020-04-06 MED ORDER — BISACODYL 5 MG PO TBEC: 5.0000 mg | DELAYED_RELEASE_TABLET | Freq: Every day | ORAL | 0 refills | Status: DC | PRN

## 2020-04-06 MED ORDER — METRONIDAZOLE 500 MG PO TABS: 500.0000 mg | ORAL_TABLET | Freq: Three times a day (TID) | ORAL | 0 refills | Status: DC

## 2020-04-06 MED ORDER — CYCLOBENZAPRINE HCL 10 MG PO TABS: 10.0000 mg | ORAL_TABLET | Freq: Three times a day (TID) | ORAL | 0 refills | Status: DC | PRN

## 2020-04-06 MED ORDER — ASCORBIC ACID 500 MG PO TABS: 500.0000 mg | ORAL_TABLET | Freq: Every day | ORAL | 0 refills | Status: DC

## 2020-04-06 MED ORDER — APIXABAN 5 MG PO TABS: 5.0000 mg | ORAL_TABLET | Freq: Two times a day (BID) | ORAL | 0 refills | Status: DC

## 2020-04-06 MED ORDER — FERROUS SULFATE 325 (65 FE) MG PO TABS: 325.0000 mg | ORAL_TABLET | Freq: Every day | ORAL | 0 refills | Status: DC

## 2020-04-06 MED ORDER — PANTOPRAZOLE SODIUM 40 MG PO TBEC: 40.0000 mg | DELAYED_RELEASE_TABLET | Freq: Every day | ORAL | 0 refills | Status: DC

## 2020-04-06 MED ORDER — NICOTINE 14 MG/24HR TD PT24: 14.0000 mg | MEDICATED_PATCH | Freq: Every day | TRANSDERMAL | 0 refills | Status: DC

## 2020-04-06 MED ORDER — CEFIXIME 400 MG PO CAPS: 400.0000 mg | ORAL_CAPSULE | Freq: Every day | ORAL | 0 refills | Status: DC

## 2020-04-06 MED ORDER — ACETAMINOPHEN 325 MG PO TABS: 650.0000 mg | ORAL_TABLET | ORAL | 0 refills | Status: DC | PRN

## 2020-04-06 MED ORDER — ALUM & MAG HYDROXIDE-SIMETH 200-200-20 MG/5ML PO SUSP: 30.0000 mL | Freq: Four times a day (QID) | ORAL | 0 refills | Status: DC | PRN
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
## 2020-04-06 NOTE — Significant Event (Signed)
Patient is all set be discharge to [facility]. AVS reviewed with patient and a copy of it is  given to patient; patient verbalize understanding of medications and follow up appointments.   A copy of AVS is placed in packet for receiving facility along with his prescription papers. [facility] have been called and is told that patient is next on their list. Patient has all his belongings next to him to be for discharge and have been accounted for by patient.   While patient is currently waiting on transport, charge RN will assume over care and have been given a report.

## 2020-04-06 NOTE — Significant Event (Signed)
Report called to [facility] and gave report to Baxter Flattery, who stated that patient will be going into room #7.
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
## 2020-04-06 NOTE — TOC Transition Note (Signed)
Transition of Care [facility]) - CM/SW Discharge Note   Patient Details  Name: Jesse Mcintyre MRN: [id number] Date of Birth: 1956/06/14  [facility] CM/SW Contact:  Vinie Sill, [place] Phone Number: 04/06/2020, 1:17 PM   Clinical Narrative:     Patient will DC to: [facility] in [place] Date:04/06/2020 Family Notified: left voice message w/Carla, patient's sister  Transport By: Corey Harold   Per MD patient is ready for discharge. RN, patient, and facility notified of DC. Discharge Summary sent to facility. RN given number for [phone]. Ambulance transport requested for patient.   Clinical Social Worker signing off.  Thurmond Butts, MSW, LCSWA Clinical Social Worker    Final next level of care: Skilled Nursing Facility Barriers to Discharge: Barriers Resolved   Patient Goals and CMS Choice   CMS Medicare.gov Compare Post Acute Care list provided to:: Patient Represenative (must comment) Ennis Forts, sister) Choice offered to / list presented to : Adult Children  Discharge Placement              Patient chooses bed at: [facility] Patient to be transferred to facility by: [place] Name of family member notified: Angela Nevin, sister Patient and family notified of of transfer: 04/06/20  Discharge Plan and Services In-house Referral: Clinical Social Work                                   Social Determinants of Health (SDOH) Interventions     Readmission Risk Interventions No flowsheet data found.
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
## 2020-04-07 ENCOUNTER — Encounter: Payer: Self-pay | Admitting: *Deleted

## 2020-04-07 ENCOUNTER — Encounter: Payer: Self-pay | Admitting: Thoracic Surgery (Cardiothoracic Vascular Surgery)

## 2020-04-07 ENCOUNTER — Other Ambulatory Visit: Payer: Self-pay | Admitting: Hematology & Oncology

## 2020-04-07 DIAGNOSIS — C3490 Malignant neoplasm of unspecified part of unspecified bronchus or lung: Secondary | ICD-10-CM

## 2020-04-07 NOTE — Progress Notes (Signed)
  No note needed.  A mistake by Epic.  Jesse Mcintyre

## 2020-04-07 NOTE — Progress Notes (Signed)
Reviewed this referral with Dr Marin Olp. He requested Oncotype MAP testing and a PET scan prior to his new patient appointment. Order placed and requisition sent for Oncotype testing.  Called and spoke to patient's sister Angela Nevin. She states there has been some confusion, as the patient will be seen at [facility] by Dr Baird Cancer. She wishes for [place] to close the referral.   Referral will be closed. Order for PET cancelled. Public librarian and cancelled request for Oncotype testing.

## 2020-04-27 ENCOUNTER — Encounter (HOSPITAL_COMMUNITY): Payer: Self-pay | Admitting: Neurosurgery

## 2020-12-10 DEATH — deceased
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
